# Patient Record
Sex: Female | Born: 1975 | Race: Black or African American | Hispanic: No | Marital: Married | Smoking: Never smoker
Health system: Southern US, Community
[De-identification: ages and names within clinical notes are randomized; demographics above are authoritative.]

## PROBLEM LIST (undated history)

## (undated) DIAGNOSIS — I10 Essential (primary) hypertension: Secondary | ICD-10-CM

## (undated) DIAGNOSIS — F419 Anxiety disorder, unspecified: Secondary | ICD-10-CM

## (undated) DIAGNOSIS — D649 Anemia, unspecified: Secondary | ICD-10-CM

## (undated) DIAGNOSIS — R569 Unspecified convulsions: Secondary | ICD-10-CM

## (undated) HISTORY — PX: WISDOM TOOTH EXTRACTION: SHX21

## (undated) HISTORY — DX: Anemia, unspecified: D64.9

## (undated) HISTORY — DX: Anxiety disorder, unspecified: F41.9

## (undated) HISTORY — DX: Unspecified convulsions: R56.9

## (undated) HISTORY — PX: OTHER SURGICAL HISTORY: SHX169

---

## 2005-06-12 ENCOUNTER — Other Ambulatory Visit: Admission: RE | Admit: 2005-06-12 | Discharge: 2005-06-12 | Payer: Self-pay | Admitting: Obstetrics and Gynecology

## 2008-01-11 ENCOUNTER — Other Ambulatory Visit: Admission: RE | Admit: 2008-01-11 | Discharge: 2008-01-11 | Payer: Self-pay | Admitting: Family Medicine

## 2010-08-29 ENCOUNTER — Other Ambulatory Visit (HOSPITAL_COMMUNITY)
Admission: RE | Admit: 2010-08-29 | Discharge: 2010-08-29 | Disposition: A | Discharge status: Home / Self Care | Payer: BC Managed Care – HMO | Source: Ambulatory Visit | Attending: Family Medicine | Admitting: Family Medicine

## 2010-08-29 DIAGNOSIS — Z0000 Encounter for general adult medical examination without abnormal findings: Secondary | ICD-10-CM | POA: Insufficient documentation

## 2011-09-08 ENCOUNTER — Other Ambulatory Visit (HOSPITAL_COMMUNITY)
Admission: RE | Admit: 2011-09-08 | Discharge: 2011-09-08 | Disposition: A | Discharge status: Home / Self Care | Payer: BC Managed Care – HMO | Source: Ambulatory Visit | Attending: Obstetrics and Gynecology | Admitting: Obstetrics and Gynecology

## 2011-09-08 DIAGNOSIS — Z01419 Encounter for gynecological examination (general) (routine) without abnormal findings: Secondary | ICD-10-CM | POA: Insufficient documentation

## 2014-07-26 ENCOUNTER — Other Ambulatory Visit: Payer: Self-pay | Admitting: Physician Assistant

## 2014-07-26 ENCOUNTER — Ambulatory Visit
Admission: RE | Admit: 2014-07-26 | Discharge: 2014-07-26 | Disposition: A | Discharge status: Home / Self Care | Payer: BLUE CROSS/BLUE SHIELD | Source: Ambulatory Visit | Attending: Physician Assistant | Admitting: Physician Assistant

## 2014-07-26 DIAGNOSIS — R058 Other specified cough: Secondary | ICD-10-CM

## 2014-07-26 DIAGNOSIS — R05 Cough: Secondary | ICD-10-CM

## 2015-07-10 ENCOUNTER — Other Ambulatory Visit: Payer: Self-pay | Admitting: Physician Assistant

## 2015-07-10 ENCOUNTER — Other Ambulatory Visit (HOSPITAL_COMMUNITY)
Admission: RE | Admit: 2015-07-10 | Discharge: 2015-07-10 | Disposition: A | Discharge status: Home / Self Care | Payer: BLUE CROSS/BLUE SHIELD | Source: Ambulatory Visit | Attending: Physician Assistant | Admitting: Physician Assistant

## 2015-07-10 DIAGNOSIS — Z01419 Encounter for gynecological examination (general) (routine) without abnormal findings: Secondary | ICD-10-CM | POA: Diagnosis present

## 2015-07-10 DIAGNOSIS — Z1151 Encounter for screening for human papillomavirus (HPV): Secondary | ICD-10-CM | POA: Diagnosis present

## 2015-07-11 LAB — CYTOLOGY - PAP

## 2015-10-03 DIAGNOSIS — F4322 Adjustment disorder with anxiety: Secondary | ICD-10-CM | POA: Diagnosis not present

## 2015-10-10 DIAGNOSIS — F4322 Adjustment disorder with anxiety: Secondary | ICD-10-CM | POA: Diagnosis not present

## 2015-10-17 DIAGNOSIS — F4322 Adjustment disorder with anxiety: Secondary | ICD-10-CM | POA: Diagnosis not present

## 2015-11-28 DIAGNOSIS — F4322 Adjustment disorder with anxiety: Secondary | ICD-10-CM | POA: Diagnosis not present

## 2015-12-12 DIAGNOSIS — F4322 Adjustment disorder with anxiety: Secondary | ICD-10-CM | POA: Diagnosis not present

## 2015-12-19 DIAGNOSIS — F4322 Adjustment disorder with anxiety: Secondary | ICD-10-CM | POA: Diagnosis not present

## 2016-01-02 DIAGNOSIS — F4322 Adjustment disorder with anxiety: Secondary | ICD-10-CM | POA: Diagnosis not present

## 2016-01-09 DIAGNOSIS — F4322 Adjustment disorder with anxiety: Secondary | ICD-10-CM | POA: Diagnosis not present

## 2016-01-30 DIAGNOSIS — F4322 Adjustment disorder with anxiety: Secondary | ICD-10-CM | POA: Diagnosis not present

## 2016-02-20 DIAGNOSIS — F4322 Adjustment disorder with anxiety: Secondary | ICD-10-CM | POA: Diagnosis not present

## 2016-03-06 DIAGNOSIS — F4322 Adjustment disorder with anxiety: Secondary | ICD-10-CM | POA: Diagnosis not present

## 2016-03-13 DIAGNOSIS — F4322 Adjustment disorder with anxiety: Secondary | ICD-10-CM | POA: Diagnosis not present

## 2016-03-20 DIAGNOSIS — F4322 Adjustment disorder with anxiety: Secondary | ICD-10-CM | POA: Diagnosis not present

## 2016-03-27 DIAGNOSIS — F4322 Adjustment disorder with anxiety: Secondary | ICD-10-CM | POA: Diagnosis not present

## 2016-04-03 DIAGNOSIS — F4322 Adjustment disorder with anxiety: Secondary | ICD-10-CM | POA: Diagnosis not present

## 2016-04-10 DIAGNOSIS — F4322 Adjustment disorder with anxiety: Secondary | ICD-10-CM | POA: Diagnosis not present

## 2016-04-17 DIAGNOSIS — F4322 Adjustment disorder with anxiety: Secondary | ICD-10-CM | POA: Diagnosis not present

## 2016-05-01 DIAGNOSIS — F4322 Adjustment disorder with anxiety: Secondary | ICD-10-CM | POA: Diagnosis not present

## 2016-05-05 DIAGNOSIS — H52223 Regular astigmatism, bilateral: Secondary | ICD-10-CM | POA: Diagnosis not present

## 2016-05-08 DIAGNOSIS — F4322 Adjustment disorder with anxiety: Secondary | ICD-10-CM | POA: Diagnosis not present

## 2016-05-22 DIAGNOSIS — F4322 Adjustment disorder with anxiety: Secondary | ICD-10-CM | POA: Diagnosis not present

## 2016-05-29 DIAGNOSIS — F4322 Adjustment disorder with anxiety: Secondary | ICD-10-CM | POA: Diagnosis not present

## 2016-06-05 DIAGNOSIS — F4322 Adjustment disorder with anxiety: Secondary | ICD-10-CM | POA: Diagnosis not present

## 2016-07-03 DIAGNOSIS — F4322 Adjustment disorder with anxiety: Secondary | ICD-10-CM | POA: Diagnosis not present

## 2016-07-17 DIAGNOSIS — F4322 Adjustment disorder with anxiety: Secondary | ICD-10-CM | POA: Diagnosis not present

## 2016-07-24 DIAGNOSIS — F4322 Adjustment disorder with anxiety: Secondary | ICD-10-CM | POA: Diagnosis not present

## 2016-07-29 DIAGNOSIS — Z23 Encounter for immunization: Secondary | ICD-10-CM | POA: Diagnosis not present

## 2016-08-07 DIAGNOSIS — F4322 Adjustment disorder with anxiety: Secondary | ICD-10-CM | POA: Diagnosis not present

## 2016-08-21 DIAGNOSIS — F411 Generalized anxiety disorder: Secondary | ICD-10-CM | POA: Diagnosis not present

## 2016-08-21 DIAGNOSIS — F4322 Adjustment disorder with anxiety: Secondary | ICD-10-CM | POA: Diagnosis not present

## 2016-08-26 DIAGNOSIS — F411 Generalized anxiety disorder: Secondary | ICD-10-CM | POA: Diagnosis not present

## 2016-08-29 DIAGNOSIS — F411 Generalized anxiety disorder: Secondary | ICD-10-CM | POA: Diagnosis not present

## 2016-09-04 DIAGNOSIS — F4322 Adjustment disorder with anxiety: Secondary | ICD-10-CM | POA: Diagnosis not present

## 2016-09-11 DIAGNOSIS — F4322 Adjustment disorder with anxiety: Secondary | ICD-10-CM | POA: Diagnosis not present

## 2016-09-25 DIAGNOSIS — F4322 Adjustment disorder with anxiety: Secondary | ICD-10-CM | POA: Diagnosis not present

## 2016-10-02 DIAGNOSIS — F4322 Adjustment disorder with anxiety: Secondary | ICD-10-CM | POA: Diagnosis not present

## 2016-10-23 DIAGNOSIS — F4322 Adjustment disorder with anxiety: Secondary | ICD-10-CM | POA: Diagnosis not present

## 2016-10-30 DIAGNOSIS — F4322 Adjustment disorder with anxiety: Secondary | ICD-10-CM | POA: Diagnosis not present

## 2016-11-06 DIAGNOSIS — F4322 Adjustment disorder with anxiety: Secondary | ICD-10-CM | POA: Diagnosis not present

## 2016-11-12 DIAGNOSIS — R5383 Other fatigue: Secondary | ICD-10-CM | POA: Diagnosis not present

## 2016-11-12 DIAGNOSIS — F419 Anxiety disorder, unspecified: Secondary | ICD-10-CM | POA: Diagnosis not present

## 2016-11-12 DIAGNOSIS — E559 Vitamin D deficiency, unspecified: Secondary | ICD-10-CM | POA: Diagnosis not present

## 2016-11-14 DIAGNOSIS — F4322 Adjustment disorder with anxiety: Secondary | ICD-10-CM | POA: Diagnosis not present

## 2016-11-20 DIAGNOSIS — F4322 Adjustment disorder with anxiety: Secondary | ICD-10-CM | POA: Diagnosis not present

## 2016-11-27 DIAGNOSIS — F4322 Adjustment disorder with anxiety: Secondary | ICD-10-CM | POA: Diagnosis not present

## 2016-12-04 DIAGNOSIS — F4322 Adjustment disorder with anxiety: Secondary | ICD-10-CM | POA: Diagnosis not present

## 2016-12-11 DIAGNOSIS — F4322 Adjustment disorder with anxiety: Secondary | ICD-10-CM | POA: Diagnosis not present

## 2016-12-16 DIAGNOSIS — F4322 Adjustment disorder with anxiety: Secondary | ICD-10-CM | POA: Diagnosis not present

## 2017-01-01 DIAGNOSIS — F4322 Adjustment disorder with anxiety: Secondary | ICD-10-CM | POA: Diagnosis not present

## 2017-01-08 DIAGNOSIS — F4322 Adjustment disorder with anxiety: Secondary | ICD-10-CM | POA: Diagnosis not present

## 2017-01-29 DIAGNOSIS — F4322 Adjustment disorder with anxiety: Secondary | ICD-10-CM | POA: Diagnosis not present

## 2017-02-04 DIAGNOSIS — F4322 Adjustment disorder with anxiety: Secondary | ICD-10-CM | POA: Diagnosis not present

## 2017-02-11 DIAGNOSIS — F4322 Adjustment disorder with anxiety: Secondary | ICD-10-CM | POA: Diagnosis not present

## 2017-02-20 DIAGNOSIS — F4322 Adjustment disorder with anxiety: Secondary | ICD-10-CM | POA: Diagnosis not present

## 2017-03-13 DIAGNOSIS — F4322 Adjustment disorder with anxiety: Secondary | ICD-10-CM | POA: Diagnosis not present

## 2017-04-10 DIAGNOSIS — H52223 Regular astigmatism, bilateral: Secondary | ICD-10-CM | POA: Diagnosis not present

## 2017-05-11 DIAGNOSIS — Z23 Encounter for immunization: Secondary | ICD-10-CM | POA: Diagnosis not present

## 2017-05-11 DIAGNOSIS — E559 Vitamin D deficiency, unspecified: Secondary | ICD-10-CM | POA: Diagnosis not present

## 2017-05-11 DIAGNOSIS — F419 Anxiety disorder, unspecified: Secondary | ICD-10-CM | POA: Diagnosis not present

## 2017-05-11 DIAGNOSIS — Z13220 Encounter for screening for lipoid disorders: Secondary | ICD-10-CM | POA: Diagnosis not present

## 2017-05-11 DIAGNOSIS — Z0000 Encounter for general adult medical examination without abnormal findings: Secondary | ICD-10-CM | POA: Diagnosis not present

## 2017-05-11 DIAGNOSIS — R030 Elevated blood-pressure reading, without diagnosis of hypertension: Secondary | ICD-10-CM | POA: Diagnosis not present

## 2017-05-29 DIAGNOSIS — F4312 Post-traumatic stress disorder, chronic: Secondary | ICD-10-CM | POA: Diagnosis not present

## 2017-06-05 DIAGNOSIS — F4312 Post-traumatic stress disorder, chronic: Secondary | ICD-10-CM | POA: Diagnosis not present

## 2017-06-12 DIAGNOSIS — F4312 Post-traumatic stress disorder, chronic: Secondary | ICD-10-CM | POA: Diagnosis not present

## 2017-06-19 DIAGNOSIS — F4312 Post-traumatic stress disorder, chronic: Secondary | ICD-10-CM | POA: Diagnosis not present

## 2017-06-26 DIAGNOSIS — F4312 Post-traumatic stress disorder, chronic: Secondary | ICD-10-CM | POA: Diagnosis not present

## 2017-07-03 DIAGNOSIS — F4312 Post-traumatic stress disorder, chronic: Secondary | ICD-10-CM | POA: Diagnosis not present

## 2017-07-17 DIAGNOSIS — F4312 Post-traumatic stress disorder, chronic: Secondary | ICD-10-CM | POA: Diagnosis not present

## 2017-07-31 DIAGNOSIS — F4312 Post-traumatic stress disorder, chronic: Secondary | ICD-10-CM | POA: Diagnosis not present

## 2017-08-07 DIAGNOSIS — F4312 Post-traumatic stress disorder, chronic: Secondary | ICD-10-CM | POA: Diagnosis not present

## 2017-08-14 DIAGNOSIS — F4312 Post-traumatic stress disorder, chronic: Secondary | ICD-10-CM | POA: Diagnosis not present

## 2017-08-21 DIAGNOSIS — F4312 Post-traumatic stress disorder, chronic: Secondary | ICD-10-CM | POA: Diagnosis not present

## 2017-08-25 DIAGNOSIS — F4322 Adjustment disorder with anxiety: Secondary | ICD-10-CM | POA: Diagnosis not present

## 2017-08-28 DIAGNOSIS — F4312 Post-traumatic stress disorder, chronic: Secondary | ICD-10-CM | POA: Diagnosis not present

## 2017-09-04 DIAGNOSIS — F4312 Post-traumatic stress disorder, chronic: Secondary | ICD-10-CM | POA: Diagnosis not present

## 2017-09-18 DIAGNOSIS — F4312 Post-traumatic stress disorder, chronic: Secondary | ICD-10-CM | POA: Diagnosis not present

## 2017-09-25 DIAGNOSIS — F4312 Post-traumatic stress disorder, chronic: Secondary | ICD-10-CM | POA: Diagnosis not present

## 2017-09-29 DIAGNOSIS — F4312 Post-traumatic stress disorder, chronic: Secondary | ICD-10-CM | POA: Diagnosis not present

## 2017-10-09 DIAGNOSIS — F4312 Post-traumatic stress disorder, chronic: Secondary | ICD-10-CM | POA: Diagnosis not present

## 2017-10-16 DIAGNOSIS — F4312 Post-traumatic stress disorder, chronic: Secondary | ICD-10-CM | POA: Diagnosis not present

## 2017-10-23 DIAGNOSIS — F4312 Post-traumatic stress disorder, chronic: Secondary | ICD-10-CM | POA: Diagnosis not present

## 2017-10-30 DIAGNOSIS — F4312 Post-traumatic stress disorder, chronic: Secondary | ICD-10-CM | POA: Diagnosis not present

## 2017-11-09 DIAGNOSIS — F419 Anxiety disorder, unspecified: Secondary | ICD-10-CM | POA: Diagnosis not present

## 2017-11-09 DIAGNOSIS — E559 Vitamin D deficiency, unspecified: Secondary | ICD-10-CM | POA: Diagnosis not present

## 2017-11-20 DIAGNOSIS — F4312 Post-traumatic stress disorder, chronic: Secondary | ICD-10-CM | POA: Diagnosis not present

## 2017-12-04 DIAGNOSIS — F4312 Post-traumatic stress disorder, chronic: Secondary | ICD-10-CM | POA: Diagnosis not present

## 2017-12-18 DIAGNOSIS — F4312 Post-traumatic stress disorder, chronic: Secondary | ICD-10-CM | POA: Diagnosis not present

## 2017-12-21 ENCOUNTER — Other Ambulatory Visit: Payer: Self-pay | Admitting: Obstetrics and Gynecology

## 2017-12-21 DIAGNOSIS — N840 Polyp of corpus uteri: Secondary | ICD-10-CM | POA: Diagnosis not present

## 2017-12-21 DIAGNOSIS — Z30433 Encounter for removal and reinsertion of intrauterine contraceptive device: Secondary | ICD-10-CM | POA: Diagnosis not present

## 2017-12-29 DIAGNOSIS — F4312 Post-traumatic stress disorder, chronic: Secondary | ICD-10-CM | POA: Diagnosis not present

## 2018-01-05 DIAGNOSIS — F4312 Post-traumatic stress disorder, chronic: Secondary | ICD-10-CM | POA: Diagnosis not present

## 2018-01-12 DIAGNOSIS — F4312 Post-traumatic stress disorder, chronic: Secondary | ICD-10-CM | POA: Diagnosis not present

## 2018-01-19 DIAGNOSIS — F4312 Post-traumatic stress disorder, chronic: Secondary | ICD-10-CM | POA: Diagnosis not present

## 2018-02-01 DIAGNOSIS — Z30431 Encounter for routine checking of intrauterine contraceptive device: Secondary | ICD-10-CM | POA: Diagnosis not present

## 2018-02-03 DIAGNOSIS — I10 Essential (primary) hypertension: Secondary | ICD-10-CM | POA: Diagnosis not present

## 2018-02-03 DIAGNOSIS — D649 Anemia, unspecified: Secondary | ICD-10-CM | POA: Diagnosis not present

## 2018-02-03 DIAGNOSIS — E559 Vitamin D deficiency, unspecified: Secondary | ICD-10-CM | POA: Diagnosis not present

## 2018-02-10 DIAGNOSIS — Z30431 Encounter for routine checking of intrauterine contraceptive device: Secondary | ICD-10-CM | POA: Diagnosis not present

## 2018-02-16 DIAGNOSIS — F4312 Post-traumatic stress disorder, chronic: Secondary | ICD-10-CM | POA: Diagnosis not present

## 2018-02-23 DIAGNOSIS — F4312 Post-traumatic stress disorder, chronic: Secondary | ICD-10-CM | POA: Diagnosis not present

## 2018-03-02 DIAGNOSIS — F4312 Post-traumatic stress disorder, chronic: Secondary | ICD-10-CM | POA: Diagnosis not present

## 2018-03-16 DIAGNOSIS — F4312 Post-traumatic stress disorder, chronic: Secondary | ICD-10-CM | POA: Diagnosis not present

## 2018-03-23 DIAGNOSIS — F4312 Post-traumatic stress disorder, chronic: Secondary | ICD-10-CM | POA: Diagnosis not present

## 2018-03-30 DIAGNOSIS — F4312 Post-traumatic stress disorder, chronic: Secondary | ICD-10-CM | POA: Diagnosis not present

## 2018-04-06 DIAGNOSIS — F4312 Post-traumatic stress disorder, chronic: Secondary | ICD-10-CM | POA: Diagnosis not present

## 2018-04-20 DIAGNOSIS — F4312 Post-traumatic stress disorder, chronic: Secondary | ICD-10-CM | POA: Diagnosis not present

## 2018-04-27 DIAGNOSIS — F4312 Post-traumatic stress disorder, chronic: Secondary | ICD-10-CM | POA: Diagnosis not present

## 2018-05-04 DIAGNOSIS — F4312 Post-traumatic stress disorder, chronic: Secondary | ICD-10-CM | POA: Diagnosis not present

## 2018-05-11 DIAGNOSIS — F4312 Post-traumatic stress disorder, chronic: Secondary | ICD-10-CM | POA: Diagnosis not present

## 2018-05-17 DIAGNOSIS — E559 Vitamin D deficiency, unspecified: Secondary | ICD-10-CM | POA: Diagnosis not present

## 2018-05-17 DIAGNOSIS — F419 Anxiety disorder, unspecified: Secondary | ICD-10-CM | POA: Diagnosis not present

## 2018-05-17 DIAGNOSIS — Z0000 Encounter for general adult medical examination without abnormal findings: Secondary | ICD-10-CM | POA: Diagnosis not present

## 2018-05-17 DIAGNOSIS — Z23 Encounter for immunization: Secondary | ICD-10-CM | POA: Diagnosis not present

## 2018-05-17 DIAGNOSIS — I10 Essential (primary) hypertension: Secondary | ICD-10-CM | POA: Diagnosis not present

## 2018-05-18 DIAGNOSIS — F4312 Post-traumatic stress disorder, chronic: Secondary | ICD-10-CM | POA: Diagnosis not present

## 2018-05-25 DIAGNOSIS — F4312 Post-traumatic stress disorder, chronic: Secondary | ICD-10-CM | POA: Diagnosis not present

## 2018-06-29 DIAGNOSIS — F4312 Post-traumatic stress disorder, chronic: Secondary | ICD-10-CM | POA: Diagnosis not present

## 2018-06-30 DIAGNOSIS — H524 Presbyopia: Secondary | ICD-10-CM | POA: Diagnosis not present

## 2018-07-09 DIAGNOSIS — F4312 Post-traumatic stress disorder, chronic: Secondary | ICD-10-CM | POA: Diagnosis not present

## 2018-07-13 DIAGNOSIS — F4312 Post-traumatic stress disorder, chronic: Secondary | ICD-10-CM | POA: Diagnosis not present

## 2018-07-20 DIAGNOSIS — F4312 Post-traumatic stress disorder, chronic: Secondary | ICD-10-CM | POA: Diagnosis not present

## 2018-08-03 DIAGNOSIS — F4312 Post-traumatic stress disorder, chronic: Secondary | ICD-10-CM | POA: Diagnosis not present

## 2018-08-10 DIAGNOSIS — F4312 Post-traumatic stress disorder, chronic: Secondary | ICD-10-CM | POA: Diagnosis not present

## 2018-08-17 DIAGNOSIS — F4312 Post-traumatic stress disorder, chronic: Secondary | ICD-10-CM | POA: Diagnosis not present

## 2018-08-24 DIAGNOSIS — F4312 Post-traumatic stress disorder, chronic: Secondary | ICD-10-CM | POA: Diagnosis not present

## 2018-08-31 DIAGNOSIS — F4312 Post-traumatic stress disorder, chronic: Secondary | ICD-10-CM | POA: Diagnosis not present

## 2018-09-14 DIAGNOSIS — F4312 Post-traumatic stress disorder, chronic: Secondary | ICD-10-CM | POA: Diagnosis not present

## 2018-09-21 DIAGNOSIS — F4312 Post-traumatic stress disorder, chronic: Secondary | ICD-10-CM | POA: Diagnosis not present

## 2018-10-26 DIAGNOSIS — F4312 Post-traumatic stress disorder, chronic: Secondary | ICD-10-CM | POA: Diagnosis not present

## 2018-11-11 DIAGNOSIS — F4312 Post-traumatic stress disorder, chronic: Secondary | ICD-10-CM | POA: Diagnosis not present

## 2018-11-18 DIAGNOSIS — F4312 Post-traumatic stress disorder, chronic: Secondary | ICD-10-CM | POA: Diagnosis not present

## 2018-11-22 DIAGNOSIS — I10 Essential (primary) hypertension: Secondary | ICD-10-CM | POA: Diagnosis not present

## 2018-11-22 DIAGNOSIS — D508 Other iron deficiency anemias: Secondary | ICD-10-CM | POA: Diagnosis not present

## 2018-11-22 DIAGNOSIS — F419 Anxiety disorder, unspecified: Secondary | ICD-10-CM | POA: Diagnosis not present

## 2018-11-22 DIAGNOSIS — E559 Vitamin D deficiency, unspecified: Secondary | ICD-10-CM | POA: Diagnosis not present

## 2018-11-25 DIAGNOSIS — F4312 Post-traumatic stress disorder, chronic: Secondary | ICD-10-CM | POA: Diagnosis not present

## 2018-12-02 DIAGNOSIS — F4312 Post-traumatic stress disorder, chronic: Secondary | ICD-10-CM | POA: Diagnosis not present

## 2018-12-16 DIAGNOSIS — F4312 Post-traumatic stress disorder, chronic: Secondary | ICD-10-CM | POA: Diagnosis not present

## 2019-05-05 DIAGNOSIS — F4312 Post-traumatic stress disorder, chronic: Secondary | ICD-10-CM | POA: Diagnosis not present

## 2019-05-12 DIAGNOSIS — F4312 Post-traumatic stress disorder, chronic: Secondary | ICD-10-CM | POA: Diagnosis not present

## 2019-05-13 DIAGNOSIS — F4312 Post-traumatic stress disorder, chronic: Secondary | ICD-10-CM | POA: Diagnosis not present

## 2019-05-27 DIAGNOSIS — F4312 Post-traumatic stress disorder, chronic: Secondary | ICD-10-CM | POA: Diagnosis not present

## 2019-06-03 DIAGNOSIS — F4312 Post-traumatic stress disorder, chronic: Secondary | ICD-10-CM | POA: Diagnosis not present

## 2019-06-10 DIAGNOSIS — F4312 Post-traumatic stress disorder, chronic: Secondary | ICD-10-CM | POA: Diagnosis not present

## 2019-06-23 DIAGNOSIS — F4312 Post-traumatic stress disorder, chronic: Secondary | ICD-10-CM | POA: Diagnosis not present

## 2019-06-30 DIAGNOSIS — F4312 Post-traumatic stress disorder, chronic: Secondary | ICD-10-CM | POA: Diagnosis not present

## 2019-07-08 DIAGNOSIS — F4312 Post-traumatic stress disorder, chronic: Secondary | ICD-10-CM | POA: Diagnosis not present

## 2019-07-22 DIAGNOSIS — F4312 Post-traumatic stress disorder, chronic: Secondary | ICD-10-CM | POA: Diagnosis not present

## 2019-07-29 DIAGNOSIS — F4312 Post-traumatic stress disorder, chronic: Secondary | ICD-10-CM | POA: Diagnosis not present

## 2019-08-12 DIAGNOSIS — F4312 Post-traumatic stress disorder, chronic: Secondary | ICD-10-CM | POA: Diagnosis not present

## 2019-08-19 DIAGNOSIS — F4312 Post-traumatic stress disorder, chronic: Secondary | ICD-10-CM | POA: Diagnosis not present

## 2020-04-25 DIAGNOSIS — Z23 Encounter for immunization: Secondary | ICD-10-CM | POA: Diagnosis not present

## 2020-06-07 ENCOUNTER — Other Ambulatory Visit: Payer: Self-pay | Admitting: Family Medicine

## 2020-06-07 DIAGNOSIS — I10 Essential (primary) hypertension: Secondary | ICD-10-CM | POA: Diagnosis not present

## 2020-06-07 DIAGNOSIS — Z1231 Encounter for screening mammogram for malignant neoplasm of breast: Secondary | ICD-10-CM

## 2020-06-07 DIAGNOSIS — Z6841 Body Mass Index (BMI) 40.0 and over, adult: Secondary | ICD-10-CM | POA: Diagnosis not present

## 2020-06-07 DIAGNOSIS — F411 Generalized anxiety disorder: Secondary | ICD-10-CM | POA: Diagnosis not present

## 2020-06-08 DIAGNOSIS — F411 Generalized anxiety disorder: Secondary | ICD-10-CM | POA: Diagnosis not present

## 2020-06-08 DIAGNOSIS — I10 Essential (primary) hypertension: Secondary | ICD-10-CM | POA: Diagnosis not present

## 2020-06-13 DIAGNOSIS — I10 Essential (primary) hypertension: Secondary | ICD-10-CM | POA: Diagnosis not present

## 2020-06-13 DIAGNOSIS — Z6841 Body Mass Index (BMI) 40.0 and over, adult: Secondary | ICD-10-CM | POA: Diagnosis not present

## 2020-06-13 DIAGNOSIS — Z1331 Encounter for screening for depression: Secondary | ICD-10-CM | POA: Diagnosis not present

## 2020-06-13 DIAGNOSIS — F411 Generalized anxiety disorder: Secondary | ICD-10-CM | POA: Diagnosis not present

## 2020-06-25 DIAGNOSIS — Z03818 Encounter for observation for suspected exposure to other biological agents ruled out: Secondary | ICD-10-CM | POA: Diagnosis not present

## 2020-07-19 ENCOUNTER — Ambulatory Visit
Admission: RE | Admit: 2020-07-19 | Discharge: 2020-07-19 | Disposition: A | Discharge status: Home / Self Care | Payer: BC Managed Care – PPO | Source: Ambulatory Visit | Attending: Family Medicine | Admitting: Family Medicine

## 2020-07-19 ENCOUNTER — Other Ambulatory Visit: Payer: Self-pay

## 2020-07-19 DIAGNOSIS — Z1231 Encounter for screening mammogram for malignant neoplasm of breast: Secondary | ICD-10-CM | POA: Diagnosis not present

## 2020-07-23 ENCOUNTER — Other Ambulatory Visit: Payer: Self-pay | Admitting: Family Medicine

## 2020-07-23 DIAGNOSIS — R928 Other abnormal and inconclusive findings on diagnostic imaging of breast: Secondary | ICD-10-CM

## 2020-09-03 DIAGNOSIS — Z03818 Encounter for observation for suspected exposure to other biological agents ruled out: Secondary | ICD-10-CM | POA: Diagnosis not present

## 2020-09-07 ENCOUNTER — Other Ambulatory Visit: Payer: BC Managed Care – PPO

## 2020-09-10 ENCOUNTER — Other Ambulatory Visit: Payer: Self-pay | Admitting: Family Medicine

## 2020-09-10 DIAGNOSIS — R928 Other abnormal and inconclusive findings on diagnostic imaging of breast: Secondary | ICD-10-CM

## 2020-12-05 ENCOUNTER — Ambulatory Visit
Admission: RE | Admit: 2020-12-05 | Discharge: 2020-12-05 | Disposition: A | Discharge status: Home / Self Care | Payer: BC Managed Care – PPO | Source: Ambulatory Visit | Attending: Family Medicine | Admitting: Family Medicine

## 2020-12-05 ENCOUNTER — Other Ambulatory Visit: Payer: Self-pay | Admitting: Family Medicine

## 2020-12-05 ENCOUNTER — Other Ambulatory Visit: Payer: Self-pay

## 2020-12-05 DIAGNOSIS — R928 Other abnormal and inconclusive findings on diagnostic imaging of breast: Secondary | ICD-10-CM

## 2020-12-05 DIAGNOSIS — R922 Inconclusive mammogram: Secondary | ICD-10-CM | POA: Diagnosis not present

## 2020-12-11 ENCOUNTER — Ambulatory Visit
Admission: RE | Admit: 2020-12-11 | Discharge: 2020-12-11 | Disposition: A | Discharge status: Home / Self Care | Payer: BC Managed Care – PPO | Source: Ambulatory Visit | Attending: Family Medicine | Admitting: Family Medicine

## 2020-12-11 ENCOUNTER — Other Ambulatory Visit: Payer: Self-pay | Admitting: Family Medicine

## 2020-12-11 ENCOUNTER — Other Ambulatory Visit: Payer: Self-pay

## 2020-12-11 DIAGNOSIS — R928 Other abnormal and inconclusive findings on diagnostic imaging of breast: Secondary | ICD-10-CM | POA: Diagnosis not present

## 2020-12-11 DIAGNOSIS — R590 Localized enlarged lymph nodes: Secondary | ICD-10-CM | POA: Diagnosis not present

## 2020-12-12 LAB — SURGICAL PATHOLOGY

## 2021-05-15 DIAGNOSIS — F4312 Post-traumatic stress disorder, chronic: Secondary | ICD-10-CM | POA: Diagnosis not present

## 2021-06-07 DIAGNOSIS — F411 Generalized anxiety disorder: Secondary | ICD-10-CM | POA: Diagnosis not present

## 2021-06-07 DIAGNOSIS — F4312 Post-traumatic stress disorder, chronic: Secondary | ICD-10-CM | POA: Diagnosis not present

## 2021-12-17 ENCOUNTER — Encounter (HOSPITAL_COMMUNITY): Payer: Self-pay

## 2021-12-17 ENCOUNTER — Ambulatory Visit (HOSPITAL_COMMUNITY)
Admission: EM | Admit: 2021-12-17 | Discharge: 2021-12-17 | Disposition: A | Discharge status: Home / Self Care | Payer: BC Managed Care – PPO

## 2021-12-17 DIAGNOSIS — I10 Essential (primary) hypertension: Secondary | ICD-10-CM

## 2021-12-17 DIAGNOSIS — S80861A Insect bite (nonvenomous), right lower leg, initial encounter: Secondary | ICD-10-CM

## 2021-12-17 DIAGNOSIS — W57XXXA Bitten or stung by nonvenomous insect and other nonvenomous arthropods, initial encounter: Secondary | ICD-10-CM | POA: Diagnosis not present

## 2021-12-17 HISTORY — DX: Essential (primary) hypertension: I10

## 2021-12-17 MED ORDER — TRIAMCINOLONE ACETONIDE 0.1 % EX CREA: 1.0000 | TOPICAL_CREAM | Freq: Two times a day (BID) | CUTANEOUS | 0 refills | Status: AC

## 2021-12-19 ENCOUNTER — Ambulatory Visit (HOSPITAL_COMMUNITY)
Admission: EM | Admit: 2021-12-19 | Discharge: 2021-12-19 | Disposition: A | Discharge status: Home / Self Care | Payer: BC Managed Care – PPO | Attending: Internal Medicine | Admitting: Internal Medicine

## 2021-12-19 ENCOUNTER — Encounter (HOSPITAL_COMMUNITY): Payer: Self-pay

## 2021-12-19 DIAGNOSIS — R519 Headache, unspecified: Secondary | ICD-10-CM | POA: Diagnosis present

## 2021-12-19 DIAGNOSIS — I10 Essential (primary) hypertension: Secondary | ICD-10-CM

## 2021-12-19 DIAGNOSIS — I160 Hypertensive urgency: Secondary | ICD-10-CM

## 2021-12-19 LAB — CBC
HCT: 38.2 % (ref 36.0–46.0)
Hemoglobin: 12.4 g/dL (ref 12.0–15.0)
MCH: 25.6 pg — ABNORMAL LOW (ref 26.0–34.0)
MCHC: 32.5 g/dL (ref 30.0–36.0)
MCV: 78.9 fL — ABNORMAL LOW (ref 80.0–100.0)
Platelets: 345 10*3/uL (ref 150–400)
RBC: 4.84 MIL/uL (ref 3.87–5.11)
RDW: 13.4 % (ref 11.5–15.5)
WBC: 5.3 10*3/uL (ref 4.0–10.5)
nRBC: 0 % (ref 0.0–0.2)

## 2021-12-19 LAB — COMPREHENSIVE METABOLIC PANEL
ALT: 11 U/L (ref 0–44)
AST: 14 U/L — ABNORMAL LOW (ref 15–41)
Albumin: 3.5 g/dL (ref 3.5–5.0)
Alkaline Phosphatase: 80 U/L (ref 38–126)
Anion gap: 13 (ref 5–15)
BUN: 7 mg/dL (ref 6–20)
CO2: 23 mmol/L (ref 22–32)
Calcium: 8.8 mg/dL — ABNORMAL LOW (ref 8.9–10.3)
Chloride: 102 mmol/L (ref 98–111)
Creatinine, Ser: 0.86 mg/dL (ref 0.44–1.00)
GFR, Estimated: >60 mL/min (ref >60)
Glucose, Bld: 82 mg/dL (ref 70–99)
Potassium: 3.3 mmol/L — ABNORMAL LOW (ref 3.5–5.1)
Sodium: 138 mmol/L (ref 135–145)
Total Bilirubin: 0.8 mg/dL (ref 0.3–1.2)
Total Protein: 7.7 g/dL (ref 6.5–8.1)

## 2021-12-19 MED ORDER — AMLODIPINE BESYLATE 10 MG PO TABS: 10.0000 mg | ORAL_TABLET | Freq: Every day | ORAL | 1 refills | Status: DC

## 2021-12-19 MED ORDER — VALSARTAN 160 MG PO TABS: 160.0000 mg | ORAL_TABLET | Freq: Every day | ORAL | 1 refills | Status: DC

## 2021-12-19 MED ORDER — VALSARTAN 160 MG PO TABS: 320.0000 mg | ORAL_TABLET | Freq: Every day | ORAL | 1 refills | Status: DC

## 2021-12-19 MED ORDER — ACETAMINOPHEN 500 MG PO TABS: 1000.0000 mg | ORAL_TABLET | Freq: Four times a day (QID) | ORAL | 0 refills | Status: AC | PRN

## 2021-12-19 NOTE — ED Provider Notes (Signed)
[facility]    CSN: [id number] Arrival date & time: 12/19/21  0840      History   Chief Complaint Chief Complaint  Patient presents with   Hypertension    [name] is a 46 y.o. female.   Patient presents to urgent care for evaluation of elevated blood pressure.  She states that she was seen here couple of days ago with elevated blood pressure and told to monitor her blood pressure at home and return if it continues to be elevated.  Patient states that her home blood pressure cuff was reading 180s over 100s this morning and she developed a slight headache to the top of her head.  Currently rates head pain at a 3 on a scale of 0-10 and has not attempted use of any over-the-counter medications for this.  She has consistently been taking valsartan 160 mg once daily for the last 3 to 4 months.  She is in the process of changing primary care doctors and has an appointment with her new PCP at the end of July.  Denies blurry vision, decreased visual acuity, nausea, vomiting, back pain, URI symptoms, urinary symptoms, abdominal pain, diarrhea, constipation, and dizziness.  She is reporting a feeling of being "flushed" at this time.  Denies personal history of cardiovascular or neurologic problems.  No numbness or tingling reported. No other aggravating or relieving factors identified for symptoms at this time.    Hypertension    Past Medical History:  Diagnosis Date   Hypertension     There are no problems to display for this patient.   History reviewed. No pertinent surgical history.  OB History   No obstetric history on file.      Home Medications    Prior to Admission medications   Medication Sig Start Date End Date Taking? Authorizing Provider  acetaminophen (TYLENOL) 500 MG tablet Take 2 tablets (1,000 mg total) by mouth every 6 (six) hours as needed. 12/19/21  Yes Carlisle Beers, FNP  amLODipine (NORVASC) 10 MG tablet Take 1 tablet (10  mg total) by mouth daily. 12/19/21  Yes Carlisle Beers, FNP  valsartan (DIOVAN) 160 MG tablet Take 1 tablet (160 mg total) by mouth daily. 12/19/21  Yes Carlisle Beers, FNP  triamcinolone cream (KENALOG) 0.1 % Apply 1 Application topically 2 (two) times daily for 7 days. 12/17/21 12/24/21  Rhys Martini, PA-C    Family History Family History  Problem Relation Age of Onset   Breast cancer Neg Hx     Social History Social History   Tobacco Use   Smoking status: Never   Smokeless tobacco: Never  Substance Use Topics   Alcohol use: Not Currently   Drug use: Yes    Types: Marijuana     Allergies   Patient has no known allergies.   Review of Systems Review of Systems Per HPI  Physical Exam Triage Vital Signs ED Triage Vitals  Enc Vitals Group     BP 12/19/21 0921 (!) 218/136     Pulse Rate 12/19/21 0921 76     Resp 12/19/21 0921 18     Temp 12/19/21 0921 99 F (37.2 C)     Temp Source 12/19/21 0921 Oral     SpO2 12/19/21 0921 97 %     Weight --      Height --      Head Circumference --      Peak Flow --      Pain  Score 12/19/21 0922 1     Pain Loc --      Pain Edu? --      Excl. in GC? --    No data found.  Updated Vital Signs BP (!) 184/104 (BP Location: Left Arm)   Pulse 76   Temp 99 F (37.2 C) (Oral)   Resp 18   SpO2 97%   Visual Acuity Right Eye Distance:   Left Eye Distance:   Bilateral Distance:    Right Eye Near:   Left Eye Near:    Bilateral Near:     Physical Exam Vitals and nursing note reviewed.  Constitutional:      Appearance: Normal appearance. She is not ill-appearing or toxic-appearing.     Comments: Very pleasant patient sitting on exam in position of comfort table in no acute distress.   HENT:     Head: Normocephalic and atraumatic.     Right Ear: Hearing, tympanic membrane, ear canal and external ear normal.     Left Ear: Hearing, tympanic membrane, ear canal and external ear normal.     Nose: Nose normal.      Mouth/Throat:     Lips: Pink.     Mouth: Mucous membranes are moist.  Eyes:     General: Lids are normal. Vision grossly intact. Gaze aligned appropriately.     Extraocular Movements: Extraocular movements intact.     Conjunctiva/sclera: Conjunctivae normal.  Cardiovascular:     Rate and Rhythm: Normal rate and regular rhythm.     Heart sounds: Normal heart sounds, S1 normal and S2 normal.  Pulmonary:     Effort: Pulmonary effort is normal. No respiratory distress.     Breath sounds: Normal breath sounds and air entry.  Abdominal:     General: Bowel sounds are normal.     Palpations: Abdomen is soft.     Tenderness: There is no abdominal tenderness. There is no right CVA tenderness, left CVA tenderness or guarding.  Musculoskeletal:     Cervical back: Neck supple.  Skin:    General: Skin is warm and dry.     Capillary Refill: Capillary refill takes less than 2 seconds.     Findings: No rash.  Neurological:     General: No focal deficit present.     Mental Status: She is alert and oriented to person, place, and time. Mental status is at baseline.     Cranial Nerves: Cranial nerves 2-12 are intact. No dysarthria or facial asymmetry.     Sensory: Sensation is intact.     Motor: Motor function is intact. No weakness.     Coordination: Coordination is intact. Romberg sign negative. Coordination normal. Finger-Nose-Finger Test normal.     Gait: Gait is intact. Gait normal.     Comments: 5/5 strength against resistance with abduction and abduction of bilateral upper and lower extremities.  5/5 strength against resistance with neck movement from left to right.  No focal neurologic deficit identified to physical exam.  Psychiatric:        Mood and Affect: Mood normal.        Speech: Speech normal.        Behavior: Behavior normal.        Thought Content: Thought content normal.        Judgment: Judgment normal.      UC Treatments / Results  Labs (all labs ordered are listed, but  only abnormal results are displayed)   EKG   Radiology No results  found.  Procedures Procedures (including critical care time)  Medications Ordered in UC Medications - No data to display  Initial Impression / Assessment and Plan / UC Course  I have reviewed the triage vital signs and the nursing notes.  Pertinent labs & imaging results that were available during my care of the patient were reviewed by me and considered in my medical decision making (see chart for details).  1.  Hypertension Patient to continue taking valsartan 160 mg in the morning.  Patient to add amlodipine 10 mg to blood pressure medication regimen and attempt to gain better control of hypertension.  She is to follow-up with her primary care doctor in a few weeks regarding blood pressure management.  CBC and CMP drawn today as patient has not had blood work recently to check kidney function and blood levels.  Discussed the DASH diet and lifestyle modifications that can help to lower blood pressure.  Patient may take Tylenol as needed for headache.  Recommended increase water intake to at least 64 ounces of water per day to improve hydration status.  Patient to take her blood pressure twice daily and keep a log to bring to her primary care appointment to assist with medication management decision making. Strict ED return precautions given.  No clinical indication for further evaluation or advanced imaging at this time as patient's neurologic exam is without focal deficit.  EKG is negative for acute coronary abnormality and stable at this time.   Discussed physical exam and available lab work findings in clinic with patient.  Counseled patient regarding appropriate use of medications and potential side effects for all medications recommended or prescribed today. Discussed red flag signs and symptoms of worsening condition,when to call the PCP office, return to urgent care, and when to seek higher level of care in the  emergency department. Patient verbalizes understanding and agreement with plan. All questions answered. Patient discharged in stable condition.  Final Clinical Impressions(s) / UC Diagnoses   Final diagnoses:  Acute nonintractable headache, unspecified headache type  Hypertensive urgency     Discharge Instructions      Add amlodipine 10mg  to your blood pressure medication regimen and take this in the morning when you take your valsartan. Check your blood pressure twice daily and keep a log of this to bring to your primary care doctor appointment. We checked your lab work today and we will call you if anything is abnormal. Attempt to eat a low-salt diet to further improve your blood pressure. I have included information on the DASH diet in your paperwork.  You may take tylenol as needed for headache. Increase your water intake to at least 64 ounces of water per day to stay well hydrated.  If you develop any new or worsening symptoms or do not improve in the next 2 to 3 days, please return.  If your symptoms are severe, please go to the emergency room.  Follow-up with your primary care provider for further evaluation and management of your symptoms as well as ongoing wellness visits.  I hope you feel better!    ED Prescriptions     Medication Sig Dispense Auth. Provider   valsartan (DIOVAN) 160 MG tablet  (Status: Discontinued) Take 2 tablets (320 mg total) by mouth daily. 60 tablet M, M   acetaminophen (TYLENOL) 500 MG tablet Take 2 tablets (1,000 mg total) by mouth every 6 (six) hours as needed. 30 tablet [place] M, FNP   valsartan (DIOVAN) 160 MG  tablet Take 1 tablet (160 mg total) by mouth daily. 30 tablet Reita May M, FNP   amLODipine (NORVASC) 10 MG tablet Take 1 tablet (10 mg total) by mouth daily. 30 tablet Carlisle Beers, FNP      PDMP not reviewed this encounter.   Carlisle Beers, [place] 12/22/21 2036

## 2021-12-19 NOTE — ED Triage Notes (Signed)
Pt states having HTN in past 2 days with a mild headache. States called the online nurse last night and told to come in today.

## 2021-12-19 NOTE — Discharge Instructions (Addendum)
Add amlodipine 10mg  to your blood pressure medication regimen and take this in the morning when you take your valsartan. Check your blood pressure twice daily and keep a log of this to bring to your primary care doctor appointment. We checked your lab work today and we will call you if anything is abnormal. Attempt to eat a low-salt diet to further improve your blood pressure. I have included information on the DASH diet in your paperwork.  You may take tylenol as needed for headache. Increase your water intake to at least 64 ounces of water per day to stay well hydrated.  If you develop any new or worsening symptoms or do not improve in the next 2 to 3 days, please return.  If your symptoms are severe, please go to the emergency room.  Follow-up with your primary care provider for further evaluation and management of your symptoms as well as ongoing wellness visits.  I hope you feel better!

## 2022-01-16 ENCOUNTER — Encounter: Payer: Self-pay | Admitting: Family Medicine

## 2022-01-16 ENCOUNTER — Ambulatory Visit (INDEPENDENT_AMBULATORY_CARE_PROVIDER_SITE_OTHER): Payer: BC Managed Care – PPO | Admitting: Family Medicine

## 2022-01-16 VITALS — BP 120/94 | HR 94 | Temp 98.1°F | Ht 67.25 in | Wt 309.0 lb

## 2022-01-16 DIAGNOSIS — Z7689 Persons encountering health services in other specified circumstances: Secondary | ICD-10-CM

## 2022-01-16 DIAGNOSIS — I10 Essential (primary) hypertension: Secondary | ICD-10-CM | POA: Diagnosis not present

## 2022-01-16 NOTE — Patient Instructions (Signed)
It was nice meeting you.  Don't for get to do something for your stress.

## 2022-01-16 NOTE — Progress Notes (Signed)
Patient presents to clinic today to establish care and follow-up on ongoing concerns.  SUBJECTIVE: PMH: Patient is a 46 year old female with PMH SIG for HTN previously seen by Collie Siad, MD.  HTN: -Was taking valsartan 160 mg daily -Seen at UC last week (12/19/2021) for BP 215/100, Norvasc 10 mg added. -BP 114/84, 158/113, 114/84, 129/89, 121/80, 140/94, 133/87, 129/93, 115/86, 135/101, 147/107 -Patient was vegan x2 years, now ovopescatarian -Decreased fast food intake since UC visit -Patient notes family history of HTN in mom and maternal grandparents -Stress may be contributing to elevation in BP.  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  No seizures since then.  IUD in place: -Mirena IUD placed 12/2017  Lactose intolerance  Allergies: NKDA  Past surgical history: Breast biopsy-2022  Social history: Patient and her partner are married.  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  Patient endorses social alcohol use.  Patient denies tobacco use.  Health Maintenance: Dental -- Vision --last eye exam 2021 [facility] [place] Immunizations -- Colonoscopy -- Mammogram --June 2022 PAP --2017 LMP--July 2023 Bone Density --  Family medical history: Mom-Alive, thyroid cancer MGM-deceased, DM 2, CVA MGF-deceased, DM 2   Past Medical History:  Diagnosis Date   Hypertension     No past surgical history on file.  Current Outpatient Medications on File Prior to Visit  Medication Sig Dispense Refill   amLODipine (NORVASC) 10 MG tablet Take 1 tablet (10 mg total) by mouth daily. 30 tablet 1   levonorgestrel (MIRENA) 20 MCG/DAY IUD 1 each by Intrauterine route once.     valsartan (DIOVAN) 160 MG tablet Take 1 tablet (160 mg total) by mouth daily. 30 tablet 1   acetaminophen (TYLENOL) 500 MG tablet Take 2 tablets (1,000 mg total) by mouth every 6 (six) hours as  needed. (Patient not taking: Reported on 01/16/2022) 30 tablet 0   No current facility-administered medications on file prior to visit.    No Known Allergies  Family History  Problem Relation Age of Onset   Breast cancer Neg Hx     Social History   Socioeconomic History   Marital status: Married    Spouse name: Not on file   Number of children: Not on file   Years of education: Not on file   Highest education level: Not on file  Occupational History   Not on file  Tobacco Use   Smoking status: Never   Smokeless tobacco: Never  Substance and Sexual Activity   Alcohol use: Not Currently   Drug use: Yes    Types: Marijuana   Sexual activity: Not on file  Other Topics Concern   Not on file  Social History Narrative   Not on file   Social Determinants of Health   Financial Resource Strain: Not on file  Food Insecurity: Not on file  Transportation Needs: Not on file  Physical Activity: Not on file  Stress: Not on file  Social Connections: Not on file  Intimate Partner Violence: Not on file    ROS General: Denies fever, chills, night sweats, changes in weight, changes in appetite HEENT: Denies headaches, ear pain, changes in vision, rhinorrhea, sore throat CV: Denies CP, palpitations, SOB, orthopnea Pulm: Denies SOB, cough, wheezing GI: Denies abdominal pain, nausea, vomiting, diarrhea, constipation GU: Denies dysuria, hematuria, frequency, vaginal discharge Msk: Denies muscle cramps, joint pains Neuro: Denies weakness, numbness, tingling Skin: Denies  rashes, bruising Psych: Denies depression, anxiety, hallucinations   BP (!) 120/94 (BP Location: Right Arm, Patient Position: Sitting, Cuff Size: Large)   Pulse 94   Temp 98.1 F (36.7 C) (Oral)   Ht 5' 7.25" (1.708 m)   Wt (!) 309 lb (140.2 kg)   LMP 01/09/2022 (Approximate)   SpO2 97%   BMI 48.04 kg/m   Physical Exam Gen. Pleasant, well developed, well-nourished, in NAD HEENT - [place]/AT, PERRL, EOMI,  conjunctive clear, no scleral icterus, no nasal drainage. Lungs: no use of accessory muscles, CTAB, no wheezes, rales or rhonchi Cardiovascular: RRR,  No r/g/m, no peripheral edema Abdomen: BS present, soft, nontender,nondistended. Musculoskeletal: No deformities, moves all four extremities, no cyanosis or clubbing, normal tone Neuro:  A&Ox3, CN II-XII intact, normal gait Skin:  Warm, dry, intact, no lesions  Recent Results (from the past 2160 hour(s))  CBC     Status: Abnormal   Collection Time: 12/19/21 10:45 AM  Result Value Ref Range   WBC 5.3 4.0 - 10.5 K/uL   RBC 4.84 3.87 - 5.11 MIL/uL   Hemoglobin 12.4 12.0 - 15.0 g/dL   HCT 82.5 05.3 - 97.6 %   MCV 78.9 (L) 80.0 - 100.0 fL   MCH 25.6 (L) 26.0 - 34.0 pg   MCHC 32.5 30.0 - 36.0 g/dL   RDW 73.4 19.3 - 79.0 %   Platelets 345 150 - 400 K/uL   nRBC 0.0 0.0 - 0.2 %    Comment: Performed at [facility], [address] [place], [place] [address]  Comprehensive metabolic panel     Status: Abnormal   Collection Time: 12/19/21 10:45 AM  Result Value Ref Range   Sodium 138 135 - 145 mmol/L   Potassium 3.3 (L) 3.5 - 5.1 mmol/L   Chloride 102 98 - 111 mmol/L   CO2 23 22 - 32 mmol/L   Glucose, Bld 82 70 - 99 mg/dL    Comment: Glucose reference range applies only to samples taken after fasting for at least 8 hours.   BUN 7 6 - 20 mg/dL   Creatinine, Ser 3.53 0.44 - 1.00 mg/dL   Calcium 8.8 (L) 8.9 - 10.3 mg/dL   Total Protein 7.7 6.5 - 8.1 g/dL   Albumin 3.5 3.5 - 5.0 g/dL   AST 14 (L) 15 - 41 U/L   ALT 11 0 - 44 U/L   Alkaline Phosphatase 80 38 - 126 U/L   Total Bilirubin 0.8 0.3 - 1.2 mg/dL   GFR, Estimated >29 >92 mL/min    Comment: (NOTE) Calculated using the CKD-EPI Creatinine Equation (2021)    Anion gap 13 5 - 15    Comment: Performed at [facility], [address], [place], [place] [address]    Assessment/Plan: Essential hypertension -Uncontrolled. -BP improving since adding Norvasc 10 mg with  valsartan 150 mg daily -Discussed the importance of lifestyle modifications, including decreasing stress -Continue to monitor BP at home and keep a log to bring with you to clinic -For continued elevation Cecconi greater than 140/90 further medication adjustment.  Encounter to establish care -We reviewed the PMH, PSH, FH, SH, Meds and Allergies. -We provided refills for any medications we will prescribe as needed. -We addressed current concerns per orders and patient instructions. -We have asked for records for pertinent exams, studies, vaccines and notes from previous providers. -We have advised patient to follow up per instructions below.  Morbid obesity (HCC) -Body mass index is 48.04 kg/m. -Lifestyle  modifications encouraged. -Obtain labs such as daily, TSH at next [place] for CPE  F/u in the next few weeks at patient's convenience for CPE, labs, and HTN follow-up.  Grier Mitts, MD

## 2022-02-07 ENCOUNTER — Encounter: Payer: Self-pay | Admitting: Family Medicine

## 2022-02-07 ENCOUNTER — Ambulatory Visit (INDEPENDENT_AMBULATORY_CARE_PROVIDER_SITE_OTHER): Payer: BC Managed Care – PPO | Admitting: Family Medicine

## 2022-02-07 VITALS — BP 124/98 | HR 79 | Temp 98.0°F | Ht 67.5 in | Wt 309.6 lb

## 2022-02-07 DIAGNOSIS — I10 Essential (primary) hypertension: Secondary | ICD-10-CM | POA: Diagnosis not present

## 2022-02-07 DIAGNOSIS — Z0000 Encounter for general adult medical examination without abnormal findings: Secondary | ICD-10-CM

## 2022-02-07 DIAGNOSIS — Z1159 Encounter for screening for other viral diseases: Secondary | ICD-10-CM

## 2022-02-07 DIAGNOSIS — Z1211 Encounter for screening for malignant neoplasm of colon: Secondary | ICD-10-CM

## 2022-02-07 LAB — COMPREHENSIVE METABOLIC PANEL
ALT: 9 U/L (ref 0–35)
AST: 12 U/L (ref 0–37)
Albumin: 3.8 g/dL (ref 3.5–5.2)
Alkaline Phosphatase: 69 U/L (ref 39–117)
BUN: 9 mg/dL (ref 6–23)
CO2: 28 mEq/L (ref 19–32)
Calcium: 8.9 mg/dL (ref 8.4–10.5)
Chloride: 106 mEq/L (ref 96–112)
Creatinine, Ser: 0.81 mg/dL (ref 0.40–1.20)
GFR: 86.21 mL/min (ref >60.00)
Glucose, Bld: 86 mg/dL (ref 70–99)
Potassium: 3.6 mEq/L (ref 3.5–5.1)
Sodium: 140 mEq/L (ref 135–145)
Total Bilirubin: 0.5 mg/dL (ref 0.2–1.2)
Total Protein: 7.4 g/dL (ref 6.0–8.3)

## 2022-02-07 LAB — CBC WITH DIFFERENTIAL/PLATELET
Basophils Absolute: 0 10*3/uL (ref 0.0–0.1)
Basophils Relative: 0.8 % (ref 0.0–3.0)
Eosinophils Absolute: 0.1 10*3/uL (ref 0.0–0.7)
Eosinophils Relative: 1.5 % (ref 0.0–5.0)
HCT: 36.5 % (ref 36.0–46.0)
Hemoglobin: 11.9 g/dL — ABNORMAL LOW (ref 12.0–15.0)
Lymphocytes Relative: 45.5 % (ref 12.0–46.0)
Lymphs Abs: 2.5 10*3/uL (ref 0.7–4.0)
MCHC: 32.6 g/dL (ref 30.0–36.0)
MCV: 79.5 fl (ref 78.0–100.0)
Monocytes Absolute: 0.5 10*3/uL (ref 0.1–1.0)
Monocytes Relative: 9.9 % (ref 3.0–12.0)
Neutro Abs: 2.3 10*3/uL (ref 1.4–7.7)
Neutrophils Relative %: 42.3 % — ABNORMAL LOW (ref 43.0–77.0)
Platelets: 328 10*3/uL (ref 150.0–400.0)
RBC: 4.59 Mil/uL (ref 3.87–5.11)
RDW: 14.5 % (ref 11.5–15.5)
WBC: 5.4 10*3/uL (ref 4.0–10.5)

## 2022-02-07 LAB — LIPID PANEL
Cholesterol: 137 mg/dL (ref 0–200)
HDL: 33.6 mg/dL — ABNORMAL LOW (ref >39.00)
LDL Cholesterol: 85 mg/dL (ref 0–99)
NonHDL: 102.91
Total CHOL/HDL Ratio: 4
Triglycerides: 89 mg/dL (ref 0.0–149.0)
VLDL: 17.8 mg/dL (ref 0.0–40.0)

## 2022-02-07 LAB — TSH: TSH: 2.35 u[IU]/mL (ref 0.35–5.50)

## 2022-02-07 LAB — T4, FREE: Free T4: 0.87 ng/dL (ref 0.60–1.60)

## 2022-02-07 LAB — HEMOGLOBIN A1C: Hgb A1c MFr Bld: 6.3 % (ref 4.6–6.5)

## 2022-02-07 LAB — VITAMIN B12: Vitamin B-12: 345 pg/mL (ref 211–911)

## 2022-02-07 LAB — VITAMIN D 25 HYDROXY (VIT D DEFICIENCY, FRACTURES): VITD: 21.14 ng/mL — ABNORMAL LOW (ref 30.00–100.00)

## 2022-02-07 NOTE — Progress Notes (Signed)
Subjective:     Gretna Bergin is a 46 y.o. female and is here for a comprehensive physical exam. The patient reports doing well.  Pt returned from vacation at the beach and classes have started back at [employer].  Pt notes occasional discomfort/numbness in hand.  Taking Norvasc and valsartan for BP.  Denies headaches, CP, changes in vision.  Social History   Socioeconomic History   Marital status: Married    Spouse name: Not on file   Number of children: Not on file   Years of education: Not on file   Highest education level: Not on file  Occupational History   Not on file  Tobacco Use   Smoking status: Never   Smokeless tobacco: Never  Substance and Sexual Activity   Alcohol use: Not Currently   Drug use: Yes    Types: Marijuana   Sexual activity: Not on file  Other Topics Concern   Not on file  Social History Narrative   Not on file   Social Determinants of Health   Financial Resource Strain: Not on file  Food Insecurity: Not on file  Transportation Needs: Not on file  Physical Activity: Not on file  Stress: Not on file  Social Connections: Not on file  Intimate Partner Violence: Not on file   Health Maintenance  Topic Date Due   HIV Screening  Never done   Hepatitis C Screening  Never done   TETANUS/TDAP  Never done   COLONOSCOPY (Pts 45-68yrs Insurance coverage will need to be confirmed)  Never done   COVID-19 Vaccine (5 - Pfizer series) 06/16/2021   INFLUENZA VACCINE  01/21/2022   PAP SMEAR-Modifier  05/23/2022 (Originally 07/09/2018)   HPV VACCINES  Aged Out    The following portions of the patient's history were reviewed and updated as appropriate: allergies, current medications, past family history, past medical history, past social history, past surgical history, and problem list.  Review of Systems Pertinent items noted in HPI and remainder of comprehensive ROS otherwise negative.   Objective:    BP (!) 124/98 (BP Location: Right Arm, Cuff Size:  Large)   Pulse 79   Temp 98 F (36.7 C) (Oral)   Ht 5' 7.5" (1.715 m)   Wt (!) 309 lb 9.6 oz (140.4 kg)   LMP 01/09/2022 (Approximate)   SpO2 97%   BMI 47.77 kg/m  General appearance: alert, cooperative, and no distress Head: Normocephalic, without obvious abnormality, atraumatic Eyes: conjunctivae/corneas clear. PERRL, EOM's intact. Fundi benign. Ears: normal TM's and external ear canals both ears Nose: Nares normal. Septum midline. Mucosa normal. No drainage or sinus tenderness. Throat: lips, mucosa, and tongue normal; teeth and gums normal Neck: no adenopathy, no carotid bruit, no JVD, supple, symmetrical, trachea midline, and thyroid not enlarged, symmetric, no tenderness/mass/nodules Lungs: clear to auscultation bilaterally Heart: regular rate and rhythm, S1, S2 normal, no murmur, click, rub or gallop Abdomen: soft, non-tender; bowel sounds normal; no masses,  no organomegaly Extremities: extremities normal, atraumatic, no cyanosis or edema Pulses: 2+ and symmetric Skin: Skin color, texture, turgor normal. No rashes or lesions Lymph nodes: Cervical, supraclavicular, and axillary nodes normal. Neurologic: Alert and oriented X 3, normal strength and tone. Normal symmetric reflexes. Normal coordination and gait    Assessment:    Healthy female exam.      Plan:     Well adult exam -Anticipatory guidance given including wearing seatbelts, smoke detectors in the home, increasing physical activity, increasing p.o. intake of water and vegetables. -  Obtain labs -Referral placed for colonoscopy -Mammogram to be scheduled by patient -Immunizations reviewed -Given handouts including one on preventing carpal tunnel.  Discussed ergonomic workspace modifications. -Next CPE in 1 year See After Visit Summary for Counseling Recommendations   Essential hypertension  -Uncontrolled -Continue Norvasc 10 mg daily and valsartan 160 mg daily -Discussed the importance of lifestyle  modifications -Monitor BP at home -For elevation consistently greater than 140/90 increase valsartan/add additional medication. - Plan: CBC with Differential/Platelet, TSH, T4, Free, Lipid panel, CMP  Morbid obesity (HCC)  - Plan: Vitamin D, 25-hydroxy, Vitamin B12, CBC with Differential/Platelet, TSH, T4, Free, Hemoglobin A1c, Lipid panel, CMP  Encounter for hepatitis C screening test for low risk patient  - Plan: Hep C Antibody  Colon cancer screening  - Plan: Ambulatory referral to Gastroenterology  F/u in 4-6 wks for HTN  Abbe Amsterdam, MD

## 2022-02-10 ENCOUNTER — Other Ambulatory Visit: Payer: Self-pay | Admitting: Family Medicine

## 2022-02-10 DIAGNOSIS — E559 Vitamin D deficiency, unspecified: Secondary | ICD-10-CM

## 2022-02-10 LAB — HEPATITIS C ANTIBODY: Hepatitis C Ab: NONREACTIVE

## 2022-02-10 MED ORDER — VITAMIN D (ERGOCALCIFEROL) 1.25 MG (50000 UNIT) PO CAPS: 50000.0000 [IU] | ORAL_CAPSULE | ORAL | 0 refills | Status: DC

## 2022-03-10 ENCOUNTER — Telehealth: Payer: Self-pay | Admitting: Family Medicine

## 2022-03-10 NOTE — Telephone Encounter (Signed)
Called Pt to schedule, no answer. Left a voicemail for Pt to call the office to schedule an OV.

## 2022-03-10 NOTE — Telephone Encounter (Signed)
Pt has been sch for 03-12-2022 and also transferred to  triage nurse bp this morning was 160/108 pt is at work and does not have her bp machine. Please advise

## 2022-03-10 NOTE — Telephone Encounter (Signed)
Pt called to say she has been taken  valsartan (DIOVAN) 160 MG tablet  and BP is more elevated than before and is experiencing more headaches.  LOV: 02/07/22 = CPE  BP 150-160 over 100+  Pt refused to speak to the Triage Nurse and stated her BP is not as bad as it was, so she is going to be ok until MD calls her back.  Please advise.

## 2022-03-12 ENCOUNTER — Ambulatory Visit (INDEPENDENT_AMBULATORY_CARE_PROVIDER_SITE_OTHER): Payer: BC Managed Care – PPO | Admitting: Family Medicine

## 2022-03-12 ENCOUNTER — Encounter: Payer: Self-pay | Admitting: Family Medicine

## 2022-03-12 VITALS — BP 138/84 | HR 90 | Temp 99.5°F | Ht 67.5 in | Wt 312.6 lb

## 2022-03-12 DIAGNOSIS — Z23 Encounter for immunization: Secondary | ICD-10-CM | POA: Diagnosis not present

## 2022-03-12 DIAGNOSIS — I10 Essential (primary) hypertension: Secondary | ICD-10-CM | POA: Diagnosis not present

## 2022-03-12 MED ORDER — METOPROLOL SUCCINATE ER 25 MG PO TB24: 25.0000 mg | ORAL_TABLET | Freq: Every day | ORAL | 3 refills | Status: DC

## 2022-03-12 NOTE — Progress Notes (Signed)
Subjective:    Patient ID: Courtney Nelson, female    DOB: [birth year], 46 y.o.   MRN: [id number]  Chief Complaint  Patient presents with   Hypertension    Patient states her blood pressure reading was 160/109 today at home    HPI Patient was seen today for f/u on HTN.  Pt was taking Norvasv 10 mg and valsartan 160 mg with some improvement in bp.  At last OFV, valsartan increased to 320 mg and Norvasc held.  BP seemed to increase, 150s-160s/100s at home.  Pt developed HAs, but not as bad as over the summer prior to starting bp meds.  Pt making diet changes, she and her partner are not cooking with salt.  Pt interested in influenza and tdap vaccines.  Inquires about new COVID-19 vaccine.  Past Medical History:  Diagnosis Date   Hypertension     No Known Allergies  ROS General: Denies fever, chills, night sweats, changes in weight, changes in appetite HEENT: Denies headaches, ear pain, changes in vision, rhinorrhea, sore throat +HAs CV: Denies CP, palpitations, SOB, orthopnea Pulm: Denies SOB, cough, wheezing GI: Denies abdominal pain, nausea, vomiting, diarrhea, constipation GU: Denies dysuria, hematuria, frequency, vaginal discharge Msk: Denies muscle cramps, joint pains Neuro: Denies weakness, numbness, tingling Skin: Denies rashes, bruising Psych: Denies depression, anxiety, hallucinations     Objective:    Blood pressure 138/84, pulse 90, temperature 99.5 F (37.5 C), temperature source Oral, height 5' 7.5" (1.715 m), weight (!) 312 lb 9.6 oz (141.8 kg), SpO2 98 %.  Gen. Pleasant, well-nourished, in no distress, normal affect   HEENT: /AT, face symmetric, conjunctiva clear, no scleral icterus, PERRLA, EOMI, nares patent without drainage Lungs: no accessory muscle use Cardiovascular: RRR, no peripheral edema Neuro:  A&Ox3, CN II-XII intact, normal gait Skin:  Warm, no lesions/ rash   Wt Readings from Last 3 Encounters:  03/12/22 (!) 312 lb 9.6 oz (141.8 kg)   02/07/22 (!) 309 lb 9.6 oz (140.4 kg)  01/16/22 (!) 309 lb (140.2 kg)   BP Readings from Last 3 Encounters:  03/12/22 138/84  02/07/22 (!) 124/98  01/16/22 (!) 120/94    Lab Results  Component Value Date   WBC 5.4 02/07/2022   HGB 11.9 (L) 02/07/2022   HCT 36.5 02/07/2022   PLT 328.0 02/07/2022   GLUCOSE 86 02/07/2022   CHOL 137 02/07/2022   TRIG 89.0 02/07/2022   HDL 33.60 (L) 02/07/2022   LDLCALC 85 02/07/2022   ALT 9 02/07/2022   AST 12 02/07/2022   NA 140 02/07/2022   K 3.6 02/07/2022   CL 106 02/07/2022   CREATININE 0.81 02/07/2022   BUN 9 02/07/2022   CO2 28 02/07/2022   TSH 2.35 02/07/2022   HGBA1C 6.3 02/07/2022    Assessment/Plan:  Essential hypertension -controlled in clinic -given elevation at home discussed adjusting medication. -will d/c valsartan 320 mg daily. -start Toprol XL 25 mg daily.  R/b/a discussed. -advised will likely need to adjust bp med further.  If needed can also restart norvasc. -continue to monitor bp at home and notify clinic of readings consistently > 140/90 -given precautions.  - Plan: metoprolol succinate (TOPROL-XL) 25 MG 24 hr tablet  Need for immunization against influenza  - Plan: Flu Vaccine QUAD 6+ mos PF IM (Fluarix Quad PF)  Need for Tdap vaccination  - Plan: Tdap vaccine greater than or equal to 7yo IM  F/u in 2-4 wks  Grier Mitts, MD

## 2022-03-12 NOTE — Telephone Encounter (Signed)
Scheduled for OV today.

## 2022-03-19 ENCOUNTER — Encounter: Payer: Self-pay | Admitting: Family Medicine

## 2022-04-02 ENCOUNTER — Other Ambulatory Visit: Payer: Self-pay | Admitting: Family Medicine

## 2022-04-05 ENCOUNTER — Ambulatory Visit (HOSPITAL_COMMUNITY)
Admission: EM | Admit: 2022-04-05 | Discharge: 2022-04-05 | Disposition: A | Discharge status: Home / Self Care | Payer: BC Managed Care – PPO | Attending: Physician Assistant | Admitting: Physician Assistant

## 2022-04-05 ENCOUNTER — Encounter (HOSPITAL_COMMUNITY): Payer: Self-pay

## 2022-04-05 DIAGNOSIS — I10 Essential (primary) hypertension: Secondary | ICD-10-CM

## 2022-04-05 MED ORDER — AMLODIPINE BESYLATE 5 MG PO TABS: 5.0000 mg | ORAL_TABLET | Freq: Every day | ORAL | 2 refills | Status: DC

## 2022-04-05 NOTE — ED Triage Notes (Signed)
Patient having elevated BP, feeling flushed, headaches for a week.  No dizziness, blurred vision, nausea or vomiting.  Patient states her medications were changed. Has been on Losartan and Amlodipine. Patient is now on Metoprolol only. Onset 2 weeks ago.

## 2022-04-05 NOTE — Discharge Instructions (Signed)
Advised to combine amlodipine 5 mg daily with a metoprolol ER 25 mg. Advised to give the combination blood pressure medicine about 2 to 3 weeks to see how effective it is at lowering the blood pressure and if goal is not reached to consider increasing the amlodipine to 10 mg daily combined with the metoprolol ER 25 mg. Advised to follow-up with PCP or return to urgent care if symptoms fail to improve.

## 2022-04-05 NOTE — ED Provider Notes (Signed)
[facility]    CSN: [id number] Arrival date & time: 04/05/22  1711      History   Chief Complaint Chief Complaint  Patient presents with   Hypertension   Headache    [name] is a 46 y.o. female.   46 year old female presents with hypertension.  Patient indicates that she was taking losartan and amlodipine and this was controlling her blood pressure at a level of 123/80.  She relates she does not have any problems or side effects from the medication however her present PCP decided to convert her to metoprolol ER 25 mg daily.  She relates that she has been on this particular dose of the metoprolol for a couple of weeks and her blood pressure is elevated and not controlled.  She relates that she has been having headaches on a daily basis, with some mild intermittent dizziness.  She indicates she is not having any chest pain, shortness of breath, nausea or vomiting.  She is concerned that her blood pressure has remained high and she continues to have headaches.  She desires to have amlodipine added back to the metoprolol since this medicine seem to do better at controlling her blood pressure and she had no problems or side effects from it.  She relates she does have an appointment to see another PCP but it is not until January 2024.   Hypertension Associated symptoms include headaches.  Headache   Past Medical History:  Diagnosis Date   Hypertension     There are no problems to display for this patient.   History reviewed. No pertinent surgical history.  OB History   No obstetric history on file.      Home Medications    Prior to Admission medications   Medication Sig Start Date End Date Taking? Authorizing Provider  amLODipine (NORVASC) 5 MG tablet Take 1 tablet (5 mg total) by mouth daily. 04/05/22  Yes Ellsworth Lennox, PA-C  levonorgestrel (MIRENA) 20 MCG/DAY IUD 1 each by Intrauterine route once.   Yes [provider]  metoprolol  succinate (TOPROL-XL) 25 MG 24 hr tablet Take 1 tablet (25 mg total) by mouth daily. 03/12/22  Yes Deeann Saint, MD  Vitamin D, Ergocalciferol, (DRISDOL) 1.25 MG (50000 UNIT) CAPS capsule Take 1 capsule (50,000 Units total) by mouth every 7 (seven) days. 02/10/22  Yes Deeann Saint, MD  acetaminophen (TYLENOL) 500 MG tablet Take 2 tablets (1,000 mg total) by mouth every 6 (six) hours as needed. 12/19/21   Carlisle Beers, FNP    Family History Family History  Problem Relation Age of Onset   Breast cancer Neg Hx     Social History Social History   Tobacco Use   Smoking status: Never   Smokeless tobacco: Never  Substance Use Topics   Alcohol use: Not Currently   Drug use: Yes    Types: Marijuana     Allergies   Patient has no known allergies.   Review of Systems Review of Systems  Neurological:  Positive for headaches.     Physical Exam Triage Vital Signs ED Triage Vitals  Enc Vitals Group     BP 04/05/22 1759 (!) 175/122     Pulse Rate 04/05/22 1800 86     Resp 04/05/22 1800 18     Temp 04/05/22 1759 (!) 97.5 F (36.4 C)     Temp Source 04/05/22 1759 Oral     SpO2 04/05/22 1759 99 %     Weight --  Height --      Head Circumference --      Peak Flow --      Pain Score 04/05/22 1744 7     Pain Loc --      Pain Edu? --      Excl. in [place]? --    No data found.  Updated Vital Signs BP (!) 175/122 (BP Location: Left Arm)   Pulse 86   Temp (!) 97.5 F (36.4 C) (Oral)   Resp 18   LMP  (LMP Unknown)   SpO2 99%   Visual Acuity Right Eye Distance:   Left Eye Distance:   Bilateral Distance:    Right Eye Near:   Left Eye Near:    Bilateral Near:     Physical Exam Constitutional:      Appearance: She is well-developed.  Cardiovascular:     Rate and Rhythm: Normal rate and regular rhythm.     Heart sounds: Normal heart sounds.  Pulmonary:     Effort: Pulmonary effort is normal.     Breath sounds: Normal breath sounds and air entry. No  wheezing, rhonchi or rales.  Neurological:     Mental Status: She is alert and oriented to person, place, and time.     Cranial Nerves: Cranial nerves 2-12 are intact.     Motor: Motor function is intact.     Gait: Gait is intact.      UC Treatments / Results  Labs (all labs ordered are listed, but only abnormal results are displayed) Labs Reviewed - No data to display  EKG   Radiology No results found.  Procedures Procedures (including critical care time)  Medications Ordered in UC Medications - No data to display  Initial Impression / Assessment and Plan / UC Course  I have reviewed the triage vital signs and the nursing notes.  Pertinent labs & imaging results that were available during my care of the patient were reviewed by me and considered in my medical decision making (see chart for details).    Plan: 1.  The hypertension will be treated with the following: A.  The patient will continue to take the metoprolol ER 25 mg daily to control blood pressure. B.  Amlodipine 5 mg will be added to the medication regimen and she is advised to take 1 daily to help gain blood pressure control. C.  The patient has been instructed to have the blood pressure rechecked in about 2 to 3 weeks, and if the blood pressure is not at an acceptable level then the amlodipine should be increased to 10 mg daily combined with the metoprolol ER 25 mg. 2.  Patient advised to follow-up with PCP or return to urgent care as needed. Final Clinical Impressions(s) / UC Diagnoses   Final diagnoses:  Essential hypertension     Discharge Instructions      Advised to combine amlodipine 5 mg daily with a metoprolol ER 25 mg. Advised to give the combination blood pressure medicine about 2 to 3 weeks to see how effective it is at lowering the blood pressure and if goal is not reached to consider increasing the amlodipine to 10 mg daily combined with the metoprolol ER 25 mg. Advised to follow-up with  PCP or return to urgent care if symptoms fail to improve.    ED Prescriptions     Medication Sig Dispense Auth. Provider   amLODipine (NORVASC) 5 MG tablet Take 1 tablet (5 mg total) by mouth daily. 30 tablet  Ellsworth Lennox, PA-C      PDMP not reviewed this encounter.   Ellsworth Lennox, PA-C 04/05/22 1843

## 2022-05-07 ENCOUNTER — Telehealth: Payer: Self-pay

## 2022-05-07 NOTE — Telephone Encounter (Signed)
ATC pt to check on BP readings, no answer. LM to call office.

## 2022-07-07 ENCOUNTER — Encounter: Payer: Self-pay | Admitting: Nurse Practitioner

## 2022-07-07 ENCOUNTER — Ambulatory Visit: Payer: BC Managed Care – PPO | Attending: Nurse Practitioner | Admitting: Nurse Practitioner

## 2022-07-07 VITALS — BP 131/91 | HR 100 | Ht 67.5 in | Wt 313.2 lb

## 2022-07-07 DIAGNOSIS — I10 Essential (primary) hypertension: Secondary | ICD-10-CM | POA: Diagnosis not present

## 2022-07-07 DIAGNOSIS — Z124 Encounter for screening for malignant neoplasm of cervix: Secondary | ICD-10-CM

## 2022-07-07 DIAGNOSIS — Z1211 Encounter for screening for malignant neoplasm of colon: Secondary | ICD-10-CM

## 2022-07-07 DIAGNOSIS — Z1231 Encounter for screening mammogram for malignant neoplasm of breast: Secondary | ICD-10-CM | POA: Diagnosis not present

## 2022-07-07 MED ORDER — AMLODIPINE BESYLATE 10 MG PO TABS: 10.0000 mg | ORAL_TABLET | Freq: Every day | ORAL | 1 refills | Status: DC

## 2022-07-07 MED ORDER — METOPROLOL SUCCINATE ER 25 MG PO TB24: 25.0000 mg | ORAL_TABLET | Freq: Every day | ORAL | 1 refills | Status: DC

## 2022-07-07 NOTE — Progress Notes (Signed)
Assessment & Plan:  Courtney Nelson was seen today for establish care.  Diagnoses and all orders for this visit:  Essential hypertension DOSE CHANGE-     amLODipine (NORVASC) 10 MG tablet; Take 1 tablet (10 mg total) by mouth daily. -     metoprolol succinate (TOPROL-XL) 25 MG 24 hr tablet; Take 1 tablet (25 mg total) by mouth daily. Continue all antihypertensives as prescribed.  Reminded to bring in blood pressure log for follow  up appointment.  RECOMMENDATIONS: DASH/Mediterranean Diets are healthier choices for HTN.    Breast cancer screening by mammogram -     MM 3D SCREEN BREAST BILATERAL; Future  Morbid obesity  Discussed diet and exercise for person with BMI >48. Instructed: You must burn more calories than you eat. Losing 5 percent of your body weight should be considered a success. In the longer term, losing more than 15 percent of your body weight and staying at this weight is an extremely good result. However, keep in mind that even losing 5 percent of your body weight leads to important health benefits, so try not to get discouraged if you're not able to lose more than this.   Encounter for Papanicolaou smear for cervical cancer screening -     Ambulatory referral to Gynecology  Colon cancer screening -     Ambulatory referral to Gastroenterology    Patient has been counseled on age-appropriate routine health concerns for screening and prevention. These are reviewed and up-to-date. Referrals have been placed accordingly. Immunizations are up-to-date or declined.    Subjective:   Chief Complaint  Patient presents with   Establish Care   [name] 47 y.o. female presents to office today to establish care.  Patient has been counseled on age-appropriate routine health concerns for screening and prevention. These are reviewed and up-to-date. Referrals have been placed accordingly. Immunizations are up-to-date or declined.     MAMMOGRAM: OVERDUE: Referral  placed PAP SMEAR: OVERDUE. Referral placed COLONOSCOPY: OVERDUE. Referral placed   HTN Blood pressure is elevated today. Valsartan 320mg  daily was ineffective.  She did well with amlodipine 10 mg daily in the past. Currently taking amlodipine 5 mg which was just recently restarted.  BP Readings from Last 3 Encounters:  07/07/22 (!) 131/91  04/05/22 (!) 175/122  03/12/22 138/84     Review of Systems  Constitutional:  Negative for fever, malaise/fatigue and weight loss.  HENT: Negative.  Negative for nosebleeds.   Eyes: Negative.  Negative for blurred vision, double vision and photophobia.  Respiratory: Negative.  Negative for cough and shortness of breath.   Cardiovascular: Negative.  Negative for chest pain, palpitations and leg swelling.  Gastrointestinal: Negative.  Negative for heartburn, nausea and vomiting.  Musculoskeletal: Negative.  Negative for myalgias.  Neurological: Negative.  Negative for dizziness, focal weakness, seizures and headaches.  Psychiatric/Behavioral: Negative.  Negative for suicidal ideas.     Past Medical History:  Diagnosis Date   Hypertension     History reviewed. No pertinent surgical history.  Family History  Problem Relation Age of Onset   Breast cancer Neg Hx     Social History Reviewed with no changes to be made today.   Outpatient Medications Prior to Visit  Medication Sig Dispense Refill   acetaminophen (TYLENOL) 500 MG tablet Take 2 tablets (1,000 mg total) by mouth every 6 (six) hours as needed. 30 tablet 0   levonorgestrel (MIRENA) 20 MCG/DAY IUD 1 each by Intrauterine route once.     amLODipine (  NORVASC) 5 MG tablet Take 1 tablet (5 mg total) by mouth daily. 30 tablet 2   metoprolol succinate (TOPROL-XL) 25 MG 24 hr tablet Take 1 tablet (25 mg total) by mouth daily. 30 tablet 3   Vitamin D, Ergocalciferol, (DRISDOL) 1.25 MG (50000 UNIT) CAPS capsule Take 1 capsule (50,000 Units total) by mouth every 7 (seven) days. (Patient not  taking: Reported on 07/07/2022) 12 capsule 0   No facility-administered medications prior to visit.    No Known Allergies     Objective:    BP (!) 131/91 Comment: [facility] machine read at 143/99  Pulse 100   Ht 5' 7.5" (1.715 m)   Wt (!) 313 lb 3.2 oz (142.1 kg)   SpO2 99%   BMI 48.33 kg/m  Wt Readings from Last 3 Encounters:  07/07/22 (!) 313 lb 3.2 oz (142.1 kg)  03/12/22 (!) 312 lb 9.6 oz (141.8 kg)  02/07/22 (!) 309 lb 9.6 oz (140.4 kg)    Physical Exam Vitals and nursing note reviewed.  Constitutional:      Appearance: She is well-developed.  HENT:     Head: Normocephalic and atraumatic.  Cardiovascular:     Rate and Rhythm: Normal rate and regular rhythm.     Heart sounds: Normal heart sounds. No murmur heard.    No friction rub. No gallop.  Pulmonary:     Effort: Pulmonary effort is normal. No tachypnea or respiratory distress.     Breath sounds: Normal breath sounds. No decreased breath sounds, wheezing, rhonchi or rales.  Chest:     Chest wall: No tenderness.  Abdominal:     General: Bowel sounds are normal.     Palpations: Abdomen is soft.  Musculoskeletal:        General: Normal range of motion.     Cervical back: Normal range of motion.  Skin:    General: Skin is warm and dry.  Neurological:     Mental Status: She is alert and oriented to person, place, and time.     Coordination: Coordination normal.  Psychiatric:        Behavior: Behavior normal. Behavior is cooperative.        Thought Content: Thought content normal.        Judgment: Judgment normal.          Patient has been counseled extensively about nutrition and exercise as well as the importance of adherence with medications and regular follow-up. The patient was given clear instructions to go to ER or return to medical center if symptoms don't improve, worsen or new problems develop. The patient verbalized understanding.   Follow-up: Return in about 3 weeks (around 07/28/2022) for  virtual visit double book 1110 or 130 for BP.   [name], FNP-BC [facility] [place], [place]   07/07/2022, 2:13 PM

## 2022-07-28 ENCOUNTER — Encounter: Payer: Self-pay | Admitting: Nurse Practitioner

## 2022-07-28 ENCOUNTER — Telehealth (HOSPITAL_BASED_OUTPATIENT_CLINIC_OR_DEPARTMENT_OTHER): Payer: BC Managed Care – PPO | Admitting: Nurse Practitioner

## 2022-07-28 DIAGNOSIS — I10 Essential (primary) hypertension: Secondary | ICD-10-CM

## 2022-07-28 MED ORDER — CARVEDILOL 6.25 MG PO TABS: 6.2500 mg | ORAL_TABLET | Freq: Two times a day (BID) | ORAL | 3 refills | Status: DC

## 2022-07-28 NOTE — Progress Notes (Signed)
Virtual Visit Note  I discussed the limitations, risks, security and privacy concerns of performing an evaluation and management service by video and the availability of in person appointments. I also discussed with the patient that there may be a patient responsible charge related to this service. The patient expressed understanding and agreed to proceed.    I connected with Courtney Nelson on 07/28/22  at   1:30 PM EST  EDT by VIDEO and verified that I am speaking with the correct person using two identifiers.   Location of Patient: Private Residence   Location of Provider: [place] participating in VIRTUAL visit: Courtney Rankins FNP-BC [place]   History of Present Illness: VIRTUAL visit for: Hypertension  [name] was started on amlodipine 10 mg a few weeks ago for hypertension.  At that time she was already taking Toprol-XL 25 mg daily.  At this time based on her home readings she will continue her on amlodipine 10 mg daily and switch from Toprol-XL to carvedilol 6.25 mg twice daily. 121/92 142/105 143/99 139/109 145/98 145/104 139/98  Average blood pressure 139/100 Average heart rate 97-99 Past Medical History:  Diagnosis Date   Hypertension     History reviewed. No pertinent surgical history.  Family History  Problem Relation Age of Onset   Breast cancer Neg Hx     Social History   Socioeconomic History   Marital status: Married    Spouse name: Not on file   Number of children: Not on file   Years of education: Not on file   Highest education level: Not on file  Occupational History   Not on file  Tobacco Use   Smoking status: Never   Smokeless tobacco: Never  Vaping Use   Vaping Use: Never used  Substance and Sexual Activity   Alcohol use: Yes    Comment: occ   Drug use: Yes    Types: Marijuana   Sexual activity: Not on file  Other Topics Concern   Not on file  Social History  Narrative   Not on file   Social Determinants of Health   Financial Resource Strain: Not on file  Food Insecurity: Not on file  Transportation Needs: Not on file  Physical Activity: Not on file  Stress: Not on file  Social Connections: Not on file     Observations/Objective: Awake, alert and oriented x 3   Review of Systems  Constitutional:  Negative for fever, malaise/fatigue and weight loss.  HENT: Negative.  Negative for nosebleeds.   Eyes: Negative.  Negative for blurred vision, double vision and photophobia.  Respiratory: Negative.  Negative for cough and shortness of breath.   Cardiovascular: Negative.  Negative for chest pain, palpitations and leg swelling.  Gastrointestinal: Negative.  Negative for heartburn, nausea and vomiting.  Musculoskeletal: Negative.  Negative for myalgias.  Neurological: Negative.  Negative for dizziness, focal weakness, seizures and headaches.  Psychiatric/Behavioral: Negative.  Negative for suicidal ideas.     Assessment and Plan: Diagnoses and all orders for this visit:  Primary hypertension -     carvedilol (COREG) 6.25 MG tablet; Take 1 tablet (6.25 mg total) by mouth 2 (two) times daily with a meal. Continue amlodipine 10 mg daily as prescribed.  Stop metoprolol    Follow Up Instructions Return in about 3 weeks (around 08/18/2022) for BP recheck.     I discussed the assessment and treatment plan with the patient. The patient was provided  an opportunity to ask questions and all were answered. The patient agreed with the plan and demonstrated an understanding of the instructions.   The patient was advised to call back or seek an in-person evaluation if the symptoms worsen or if the condition fails to improve as anticipated.  I provided 11 minutes of face-to-face time during this encounter including median intraservice time, reviewing previous notes, labs, imaging, medications and explaining diagnosis and management.  Courtney Pounds,  FNP-BC

## 2022-07-29 ENCOUNTER — Ambulatory Visit (INDEPENDENT_AMBULATORY_CARE_PROVIDER_SITE_OTHER): Payer: BC Managed Care – PPO | Admitting: Nurse Practitioner

## 2022-07-29 ENCOUNTER — Encounter: Payer: Self-pay | Admitting: Nurse Practitioner

## 2022-07-29 ENCOUNTER — Other Ambulatory Visit (HOSPITAL_COMMUNITY)
Admission: RE | Admit: 2022-07-29 | Discharge: 2022-07-29 | Disposition: A | Discharge status: Home / Self Care | Payer: BC Managed Care – PPO | Source: Ambulatory Visit | Attending: Nurse Practitioner | Admitting: Nurse Practitioner

## 2022-07-29 VITALS — BP 114/80 | HR 99 | Ht 66.75 in | Wt 312.0 lb

## 2022-07-29 DIAGNOSIS — Z124 Encounter for screening for malignant neoplasm of cervix: Secondary | ICD-10-CM

## 2022-07-29 DIAGNOSIS — Z01419 Encounter for gynecological examination (general) (routine) without abnormal findings: Secondary | ICD-10-CM | POA: Diagnosis present

## 2022-07-29 DIAGNOSIS — Z30431 Encounter for routine checking of intrauterine contraceptive device: Secondary | ICD-10-CM

## 2022-07-29 NOTE — Progress Notes (Signed)
   Courtney Nelson [birth year] [id number]   History:  47 y.o. G0 presents as new patient to establish care. Amenorrheic, Mirena IUD inserted 12/2017 for heavy menses. Normal pap history. HTN managed by PCP. Established with PCP 07/07/22, mammogram and colonoscopy referrals placed.  Gynecologic History No LMP recorded. (Menstrual status: IUD). Period Pattern:  (no bleeding with mirena iud) Contraception/Family planning: IUD Sexually active: Yes, same sex partner  Health Maintenance Last Pap: 07/10/2015. Results were: Normal neg HPV Last mammogram: 07/19/2020. Results were: Possible right breast mass and adenopathy. 11/2020 follow up - benign biopsy of axillary lymph node Last colonoscopy: Never Last Dexa: Not indicated  Past medical history, past surgical history, family history and social history were all reviewed and documented in the EPIC chart. Married. 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 Wife is Environmental health practitioner at [employer].   ROS:  A ROS was performed and pertinent positives and negatives are included.  Exam:  Vitals:   07/29/22 1022  BP: 114/80  Pulse: 99  SpO2: 99%  Weight: (!) 312 lb (141.5 kg)  Height: 5' 6.75" (1.695 m)   Body mass index is 49.23 kg/m.  General appearance:  Normal Thyroid:  Symmetrical, normal in size, without palpable masses or nodularity. Respiratory  Auscultation:  Clear without wheezing or rhonchi Cardiovascular  Auscultation:  Regular rate, without rubs, murmurs or gallops  Edema/varicosities:  Not grossly evident Abdominal  Soft,nontender, without masses, guarding or rebound.  Liver/spleen:  No organomegaly noted  Hernia:  None appreciated  Skin  Inspection:  Grossly normal Breasts: Examined lying and sitting.   Right: Without masses, retractions, nipple discharge or axillary adenopathy.   Left: Without masses, retractions, nipple discharge or axillary adenopathy. Genitourinary   Inguinal/mons:  Normal without inguinal  adenopathy  External genitalia:  Normal appearing vulva with no masses, tenderness, or lesions  BUS/Urethra/Skene's glands:  Normal  Vagina:  Normal appearing with normal color and discharge, no lesions  Cervix:  Normal appearing without discharge or lesions. IUD string visible  Uterus:  Difficult to palpate due to body habitus but no gross masses or tenderness  Adnexa/parametria:     Rt: Normal in size, without masses or tenderness.   Lt: Normal in size, without masses or tenderness.  Anus and perineum: Normal  Digital rectal exam: Deferred  Patient informed chaperone available to be present for breast and pelvic exam. Patient has requested no chaperone to be present. Patient has been advised what will be completed during breast and pelvic exam.   Assessment/Plan:  47 y.o. G0 to establish care.   Well female exam with routine gynecological exam - Education provided on SBEs, importance of preventative screenings, current guidelines, high calcium diet, regular exercise, and multivitamin daily. Labs with PCP.   Encounter for routine checking of intrauterine contraceptive device (IUD) - Mirena IUD inserted 12/2017 for heavy menses. Amenorrheic. Normal exam. Aware of 8-year FDA approval.   Encounter for Papanicolaou smear for cervical cancer screening - Plan: Cytology - PAP( [facility]). Normal pap history.   Screening for breast cancer - 11/2020 benign biopsy of lymph node. Overdue. Referral placed by PCP. Normal breast exam today.  Screening for colon cancer - Has not had screening. Referral placed by PCP.   Return in 1 year for annual.     Tamela Gammon DNP, 10:50 AM 07/29/2022

## 2022-07-30 ENCOUNTER — Other Ambulatory Visit (HOSPITAL_COMMUNITY): Payer: Self-pay

## 2022-07-31 LAB — CYTOLOGY - PAP
Comment: NEGATIVE
Diagnosis: NEGATIVE
High risk HPV: NEGATIVE

## 2022-08-18 ENCOUNTER — Ambulatory Visit: Payer: BC Managed Care – PPO | Admitting: Nurse Practitioner

## 2022-09-17 ENCOUNTER — Encounter: Payer: Self-pay | Admitting: Nurse Practitioner

## 2022-09-17 ENCOUNTER — Ambulatory Visit: Payer: BC Managed Care – PPO | Attending: Nurse Practitioner | Admitting: Nurse Practitioner

## 2022-09-17 VITALS — BP 129/85 | HR 74 | Ht 66.75 in | Wt 316.4 lb

## 2022-09-17 DIAGNOSIS — I10 Essential (primary) hypertension: Secondary | ICD-10-CM

## 2022-09-17 DIAGNOSIS — R7303 Prediabetes: Secondary | ICD-10-CM | POA: Diagnosis not present

## 2022-09-17 MED ORDER — AMLODIPINE BESYLATE 10 MG PO TABS: 10.0000 mg | ORAL_TABLET | Freq: Every day | ORAL | 1 refills | Status: DC

## 2022-09-17 MED ORDER — CARVEDILOL 6.25 MG PO TABS: 6.2500 mg | ORAL_TABLET | Freq: Two times a day (BID) | ORAL | 1 refills | Status: DC

## 2022-09-17 NOTE — Progress Notes (Signed)
Assessment & Plan:  Courtney Nelson was seen today for hypertension.  Diagnoses and all orders for this visit:  Primary hypertension -     carvedilol (COREG) 6.25 MG tablet; Take 1 tablet (6.25 mg total) by mouth 2 (two) times daily with a meal. -     amLODipine (NORVASC) 10 MG tablet; Take 1 tablet (10 mg total) by mouth daily. -     CMP14+EGFR Continue all antihypertensives as prescribed.  Reminded to bring in blood pressure log for follow  up appointment.  RECOMMENDATIONS: DASH/Mediterranean Diets are healthier choices for HTN.    Prediabetes -     CMP14+EGFR -     Hemoglobin A1c    Patient has been counseled on age-appropriate routine health concerns for screening and prevention. These are reviewed and up-to-date. Referrals have been placed accordingly. Immunizations are up-to-date or declined.    Subjective:   Chief Complaint  Patient presents with   Hypertension   Hypertension Pertinent negatives include no blurred vision, chest pain, headaches, malaise/fatigue, palpitations or shortness of breath.   Courtney Nelson 47 y.o. female presents to office today for follow up to HTN  Patient has been counseled on age-appropriate routine health concerns for screening and prevention. These are reviewed and up-to-date. Referrals have been placed accordingly. Immunizations are up-to-date or declined.     MAMMOGRAM: OVERDUE: Referral placed PAP SMEAR: UTD COLONOSCOPY: OVERDUE. Referral placed   Blood pressure is well controlled today.  She is currently taking amlodipine 10 mg daily and carvedilol 6.25 mg BID. Tolerating both very well. She has her home blood pressure monitor with her today.  Diastolic blood pressure is 15 points higher than our reading.  The manual reading today was 128/76 BP Readings from Last 3 Encounters:  09/17/22 129/85  07/29/22 114/80  07/07/22 (!) 131/91     Review of Systems  Constitutional:  Negative for fever, malaise/fatigue and weight loss.   HENT: Negative.  Negative for nosebleeds.   Eyes: Negative.  Negative for blurred vision, double vision and photophobia.  Respiratory: Negative.  Negative for cough and shortness of breath.   Cardiovascular: Negative.  Negative for chest pain, palpitations and leg swelling.  Gastrointestinal: Negative.  Negative for heartburn, nausea and vomiting.  Musculoskeletal: Negative.  Negative for myalgias.  Neurological: Negative.  Negative for dizziness, focal weakness, seizures and headaches.  Psychiatric/Behavioral: Negative.  Negative for suicidal ideas.     Past Medical History:  Diagnosis Date   Anemia    Anxiety    Hypertension     Past Surgical History:  Procedure Laterality Date   mirena insertion     inserted 12-21-17    Family History  Problem Relation Age of Onset   Hypertension Mother    Diabetes Mother    Thyroid cancer Mother    Stroke Maternal Grandmother    Diabetes Maternal Grandmother    Hypertension Maternal Grandfather    Diabetes Maternal Grandfather     Social History Reviewed with no changes to be made today.   Outpatient Medications Prior to Visit  Medication Sig Dispense Refill   acetaminophen (TYLENOL) 500 MG tablet Take 2 tablets (1,000 mg total) by mouth every 6 (six) hours as needed. 30 tablet 0   levonorgestrel (MIRENA) 20 MCG/DAY IUD 1 each by Intrauterine route once.     amLODipine (NORVASC) 10 MG tablet Take 1 tablet (10 mg total) by mouth daily. 90 tablet 1   carvedilol (COREG) 6.25 MG tablet Take 1 tablet (6.25 mg total) by  mouth 2 (two) times daily with a meal. 60 tablet 3   No facility-administered medications prior to visit.    No Known Allergies     Objective:    BP 129/85 Comment: Patients BP Cuff : 134/100  Pulse 74   Ht 5' 6.75" (1.695 m)   Wt (!) 316 lb 6.4 oz (143.5 kg)   LMP  (Approximate) Comment: 3 months ago  SpO2 (!) 9%   BMI 49.93 kg/m  Wt Readings from Last 3 Encounters:  09/17/22 (!) 316 lb 6.4 oz (143.5 kg)   07/29/22 (!) 312 lb (141.5 kg)  07/07/22 (!) 313 lb 3.2 oz (142.1 kg)    Physical Exam Vitals and nursing note reviewed.  Constitutional:      Appearance: She is well-developed.  HENT:     Head: Normocephalic and atraumatic.  Cardiovascular:     Rate and Rhythm: Normal rate and regular rhythm.     Heart sounds: Normal heart sounds. No murmur heard.    No friction rub. No gallop.  Pulmonary:     Effort: Pulmonary effort is normal. No tachypnea or respiratory distress.     Breath sounds: Normal breath sounds. No decreased breath sounds, wheezing, rhonchi or rales.  Chest:     Chest wall: No tenderness.  Abdominal:     General: Bowel sounds are normal.     Palpations: Abdomen is soft.  Musculoskeletal:        General: Normal range of motion.     Cervical back: Normal range of motion.  Skin:    General: Skin is warm and dry.  Neurological:     Mental Status: She is alert and oriented to person, place, and time.     Coordination: Coordination normal.  Psychiatric:        Behavior: Behavior normal. Behavior is cooperative.        Thought Content: Thought content normal.        Judgment: Judgment normal.          Patient has been counseled extensively about nutrition and exercise as well as the importance of adherence with medications and regular follow-up. The patient was given clear instructions to go to ER or return to medical center if symptoms don't improve, worsen or new problems develop. The patient verbalized understanding.   Follow-up: Return in about 3 months (around 12/18/2022).   [name], FNP-BC [facility] and [facility], [place]   09/17/2022, 9:51 AM

## 2022-09-18 LAB — CMP14+EGFR
ALT: 9 IU/L (ref 0–32)
AST: 13 IU/L (ref 0–40)
Albumin/Globulin Ratio: 1.1 — ABNORMAL LOW (ref 1.2–2.2)
Albumin: 3.7 g/dL — ABNORMAL LOW (ref 3.9–4.9)
Alkaline Phosphatase: 84 IU/L (ref 44–121)
BUN/Creatinine Ratio: 13 (ref 9–23)
BUN: 10 mg/dL (ref 6–24)
Bilirubin Total: 0.3 mg/dL (ref 0.0–1.2)
CO2: 21 mmol/L (ref 20–29)
Calcium: 9.1 mg/dL (ref 8.7–10.2)
Chloride: 105 mmol/L (ref 96–106)
Creatinine, Ser: 0.76 mg/dL (ref 0.57–1.00)
Globulin, Total: 3.4 g/dL (ref 1.5–4.5)
Glucose: 78 mg/dL (ref 70–99)
Potassium: 4.3 mmol/L (ref 3.5–5.2)
Sodium: 139 mmol/L (ref 134–144)
Total Protein: 7.1 g/dL (ref 6.0–8.5)
eGFR: 97 mL/min/{1.73_m2} (ref >59)

## 2022-09-18 LAB — HEMOGLOBIN A1C
Est. average glucose Bld gHb Est-mCnc: 134 mg/dL
Hgb A1c MFr Bld: 6.3 % — ABNORMAL HIGH (ref 4.8–5.6)

## 2022-12-23 ENCOUNTER — Ambulatory Visit: Payer: BC Managed Care – PPO | Attending: Nurse Practitioner | Admitting: Nurse Practitioner

## 2022-12-23 ENCOUNTER — Encounter: Payer: Self-pay | Admitting: Nurse Practitioner

## 2022-12-23 VITALS — BP 130/86 | HR 70 | Ht 66.0 in | Wt 318.8 lb

## 2022-12-23 DIAGNOSIS — D649 Anemia, unspecified: Secondary | ICD-10-CM | POA: Diagnosis not present

## 2022-12-23 DIAGNOSIS — Z1211 Encounter for screening for malignant neoplasm of colon: Secondary | ICD-10-CM | POA: Diagnosis not present

## 2022-12-23 DIAGNOSIS — I10 Essential (primary) hypertension: Secondary | ICD-10-CM

## 2022-12-23 DIAGNOSIS — R7303 Prediabetes: Secondary | ICD-10-CM

## 2022-12-23 NOTE — Progress Notes (Signed)
Assessment & Plan:  Courtney Nelson was seen today for hypertension.  Diagnoses and all orders for this visit:  Primary hypertension Continue all antihypertensives as prescribed.  Reminded to bring in blood pressure log for follow  up appointment.  RECOMMENDATIONS: DASH/Mediterranean Diets are healthier choices for HTN.    Colon cancer screening -     Ambulatory referral to Gastroenterology  Prediabetes -     Hemoglobin A1c Continue blood sugar control as discussed in office today, low carbohydrate diet, and regular physical exercise as tolerated, 150 minutes per week (30 min each day, 5 days per week, or 50 min 3 days per week).    Low hemoglobin -     CBC with Differential    Patient has been counseled on age-appropriate routine health concerns for screening and prevention. These are reviewed and up-to-date. Referrals have been placed accordingly. Immunizations are up-to-date or declined.    Subjective:   Chief Complaint  Patient presents with   Hypertension   [name] 47 y.o. female presents to office today for follow up to HTN  Patient has been counseled on age-appropriate routine health concerns for screening and prevention. These are reviewed and up-to-date. Referrals have been placed accordingly. Immunizations are up-to-date or declined.  MAMMOGRAM: OVERDUE: Referral placed PAP SMEAR: UTD COLONOSCOPY: OVERDUE. Referral placed      Blood pressure is well controlled with carvedilol 6.25 mg BID and amlodipine 10 mg daily.  BP Readings from Last 3 Encounters:  12/23/22 130/86  09/17/22 129/85  07/29/22 114/80     Review of Systems  Constitutional:  Negative for fever, malaise/fatigue and weight loss.  HENT: Negative.  Negative for nosebleeds.   Eyes: Negative.  Negative for blurred vision, double vision and photophobia.  Respiratory: Negative.  Negative for cough and shortness of breath.   Cardiovascular: Negative.  Negative for chest pain,  palpitations and leg swelling.  Gastrointestinal: Negative.  Negative for heartburn, nausea and vomiting.  Musculoskeletal: Negative.  Negative for myalgias.  Neurological: Negative.  Negative for dizziness, focal weakness, seizures and headaches.  Psychiatric/Behavioral: Negative.  Negative for suicidal ideas.     Past Medical History:  Diagnosis Date   Anemia    Anxiety    Hypertension     Past Surgical History:  Procedure Laterality Date   mirena insertion     inserted 12-21-17    Family History  Problem Relation Age of Onset   Hypertension Mother    Diabetes Mother    Thyroid cancer Mother    Stroke Maternal Grandmother    Diabetes Maternal Grandmother    Hypertension Maternal Grandfather    Diabetes Maternal Grandfather     Social History Reviewed with no changes to be made today.   Outpatient Medications Prior to Visit  Medication Sig Dispense Refill   amLODipine (NORVASC) 10 MG tablet Take 1 tablet (10 mg total) by mouth daily. 90 tablet 1   carvedilol (COREG) 6.25 MG tablet Take 1 tablet (6.25 mg total) by mouth 2 (two) times daily with a meal. 180 tablet 1   levonorgestrel (MIRENA) 20 MCG/DAY IUD 1 each by Intrauterine route once.     acetaminophen (TYLENOL) 500 MG tablet Take 2 tablets (1,000 mg total) by mouth every 6 (six) hours as needed. (Patient not taking: Reported on 12/23/2022) 30 tablet 0   No facility-administered medications prior to visit.    No Known Allergies     Objective:    BP 130/86 (BP Location: Left Arm, Patient Position:  Sitting, Cuff Size: Large)   Pulse 70   Ht 5\' 6"  (1.676 m)   Wt (!) 318 lb 12.8 oz (144.6 kg)   SpO2 98%   BMI 51.46 kg/m  Wt Readings from Last 3 Encounters:  12/23/22 (!) 318 lb 12.8 oz (144.6 kg)  09/17/22 (!) 316 lb 6.4 oz (143.5 kg)  07/29/22 (!) 312 lb (141.5 kg)    Physical Exam Vitals and nursing note reviewed.  Constitutional:      Appearance: She is well-developed.  HENT:     Head: Normocephalic  and atraumatic.  Cardiovascular:     Rate and Rhythm: Normal rate and regular rhythm.     Heart sounds: Normal heart sounds. No murmur heard.    No friction rub. No gallop.  Pulmonary:     Effort: Pulmonary effort is normal. No tachypnea or respiratory distress.     Breath sounds: Normal breath sounds. No decreased breath sounds, wheezing, rhonchi or rales.  Chest:     Chest wall: No tenderness.  Abdominal:     General: Bowel sounds are normal.     Palpations: Abdomen is soft.  Musculoskeletal:        General: Normal range of motion.     Cervical back: Normal range of motion.  Skin:    General: Skin is warm and dry.  Neurological:     Mental Status: She is alert and oriented to person, place, and time.     Coordination: Coordination normal.  Psychiatric:        Behavior: Behavior normal. Behavior is cooperative.        Thought Content: Thought content normal.        Judgment: Judgment normal.          Patient has been counseled extensively about nutrition and exercise as well as the importance of adherence with medications and regular follow-up. The patient was given clear instructions to go to ER or return to medical center if symptoms don't improve, worsen or new problems develop. The patient verbalized understanding.   Follow-up: Return in about 3 months (around 03/25/2023) for physical.   Claiborne Rigg, FNP-BC [facility] and [facility] [place], [place] [phone]   12/23/2022, 12:00 PM

## 2022-12-23 NOTE — Progress Notes (Signed)
5'6.75 

## 2022-12-24 LAB — CBC WITH DIFFERENTIAL/PLATELET
Basophils Absolute: 0 10*3/uL (ref 0.0–0.2)
Basos: 1 %
EOS (ABSOLUTE): 0.1 10*3/uL (ref 0.0–0.4)
Eos: 2 %
Hematocrit: 36.8 % (ref 34.0–46.6)
Hemoglobin: 12.4 g/dL (ref 11.1–15.9)
Immature Grans (Abs): 0 10*3/uL (ref 0.0–0.1)
Immature Granulocytes: 0 %
Lymphocytes Absolute: 2.7 10*3/uL (ref 0.7–3.1)
Lymphs: 47 %
MCH: 26.6 pg (ref 26.6–33.0)
MCHC: 33.7 g/dL (ref 31.5–35.7)
MCV: 79 fL (ref 79–97)
Monocytes Absolute: 0.4 10*3/uL (ref 0.1–0.9)
Monocytes: 8 %
Neutrophils Absolute: 2.4 10*3/uL (ref 1.4–7.0)
Neutrophils: 42 %
Platelets: 338 10*3/uL (ref 150–450)
RBC: 4.67 x10E6/uL (ref 3.77–5.28)
RDW: 14 % (ref 11.7–15.4)
WBC: 5.6 10*3/uL (ref 3.4–10.8)

## 2022-12-24 LAB — HEMOGLOBIN A1C
Est. average glucose Bld gHb Est-mCnc: 128 mg/dL
Hgb A1c MFr Bld: 6.1 % — ABNORMAL HIGH (ref 4.8–5.6)

## 2023-01-28 ENCOUNTER — Telehealth: Payer: Self-pay | Admitting: *Deleted

## 2023-01-28 NOTE — Telephone Encounter (Signed)
Hi Dr Tomasa Rand-- This patient is > 50 BMI.  How do you wish to proceed? If direct to hospital, would you mind forwarding this to your CMA? Thank you!

## 2023-01-28 NOTE — Telephone Encounter (Signed)
Attempted to contact patient to offer Cologuard.  Left message explaining BMI limit, extensive wait to have elective procedure at hospital,  and option to do a Cologuard. Requested patient call back and let us know what her preference is.

## 2023-01-29 ENCOUNTER — Telehealth: Payer: Self-pay | Admitting: *Deleted

## 2023-01-29 NOTE — Telephone Encounter (Signed)
Attempted to call patient again regarding BMI.  Upon further investigation noted last entered weight was shorter than other heights, which pushed the BMI over 50.  Advised in patient to keep PV 02/06/23 and to contact us if any further questions.

## 2023-02-03 ENCOUNTER — Telehealth: Payer: Self-pay | Admitting: *Deleted

## 2023-02-03 NOTE — Telephone Encounter (Signed)
Yesi,  This pt's BMI is greater than 50; their procedure will need to be performed at the hospital.  Thanks,  John Nulty 

## 2023-02-04 IMAGING — MG MM DIGITAL DIAGNOSTIC UNILAT*R* W/ TOMO W/ CAD
8 of 14 series · 8 of 40 positions shown · non-contrast
Comparison: None.

CLINICAL DATA: The patient was called back for right breast masses
and possible right axillary adenopathy

EXAM:
DIGITAL DIAGNOSTIC UNILATERAL RIGHT MAMMOGRAM WITH TOMOSYNTHESIS AND
CAD; ULTRASOUND RIGHT BREAST LIMITED
TECHNIQUE: Right digital diagnostic mammography and breast tomosynthesis was
performed. The images were evaluated with computer-aided detection.;
Targeted ultrasound examination of the right breast was performed

[R MLO synth-2D (1 of 3)]
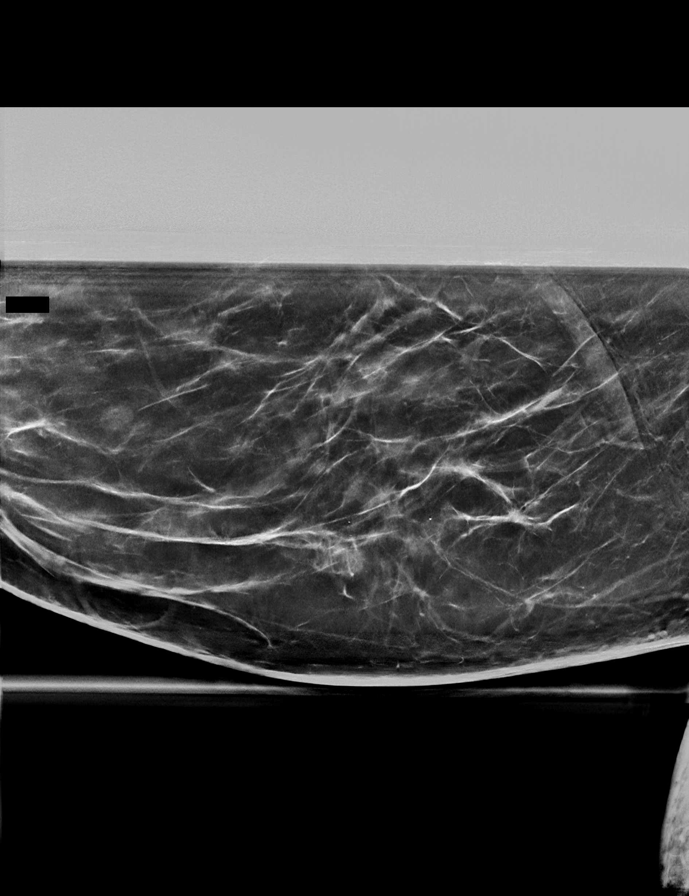

[R CC synth-2D (1 of 3)]
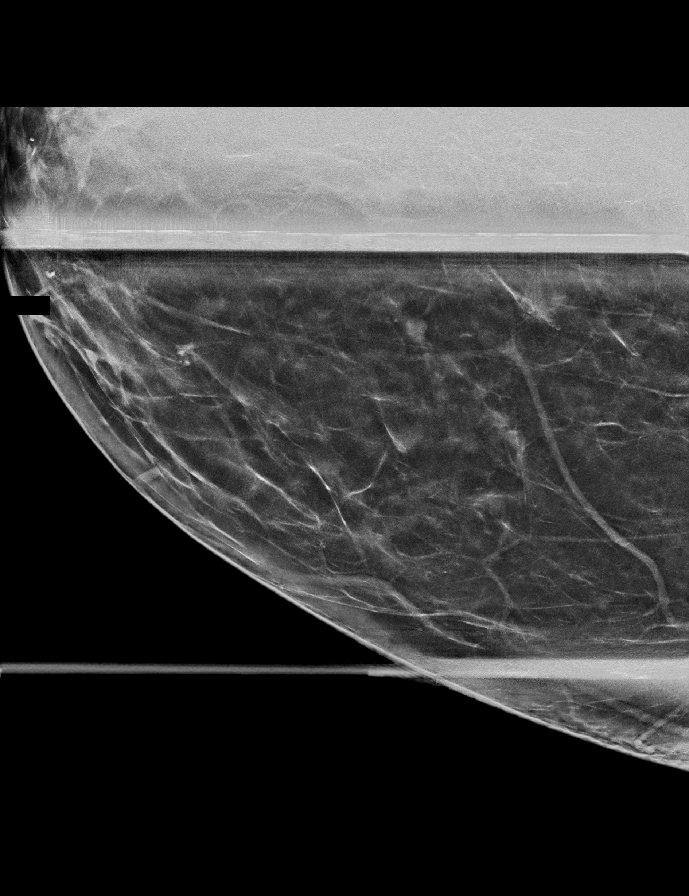

[R CC synth-2D (2 of 3)]
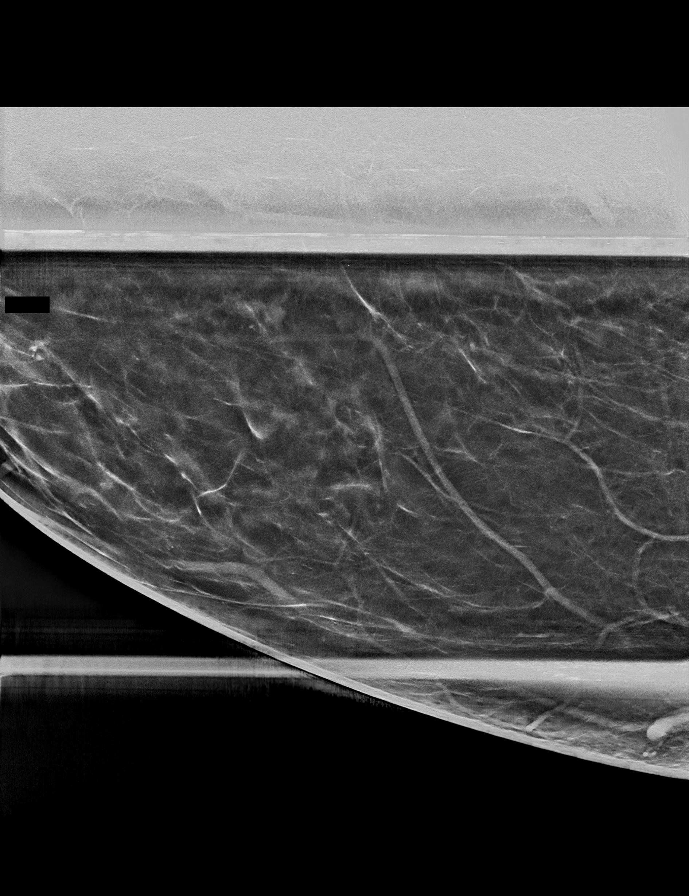

[R CC synth-2D (3 of 3)]
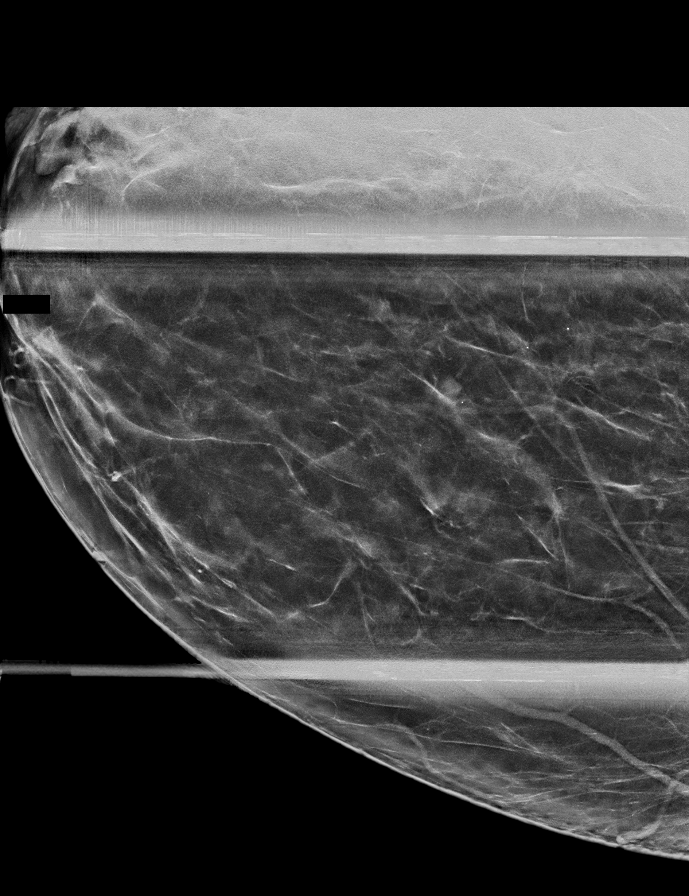

[R MLO synth-2D (2 of 3)]
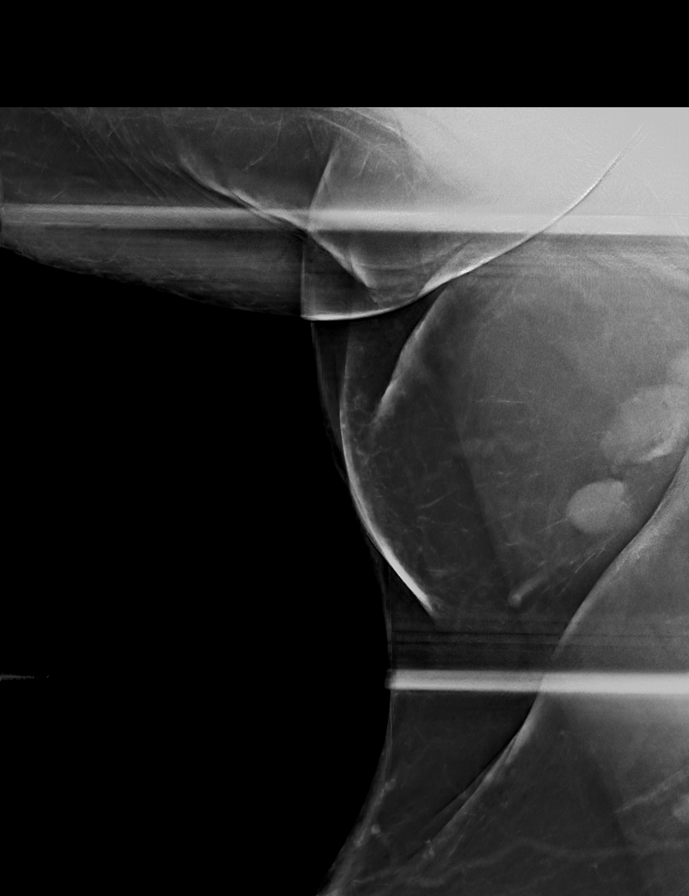

[R MLO synth-2D (3 of 3)]
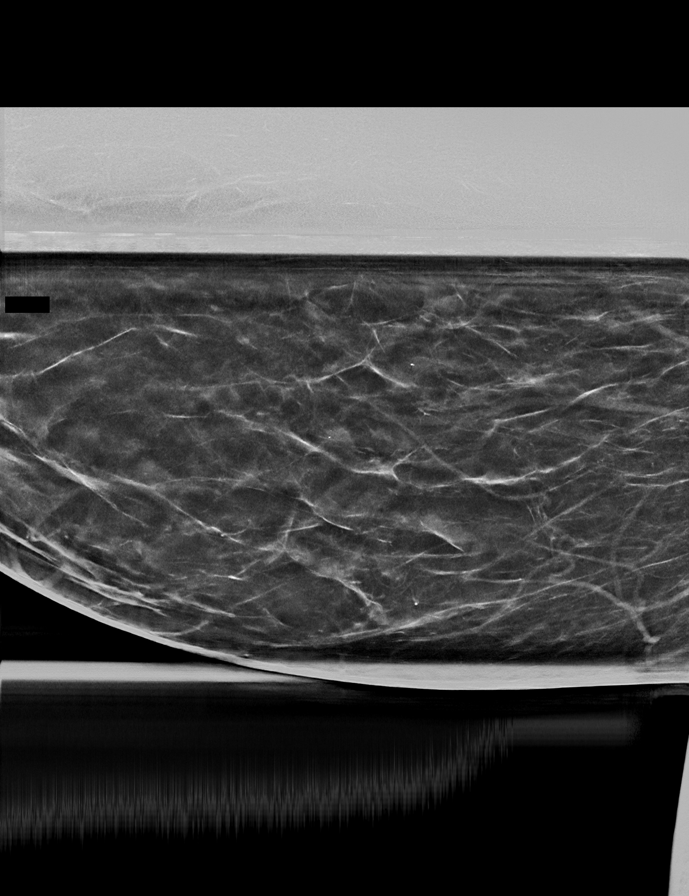

[R ML synth-2D]
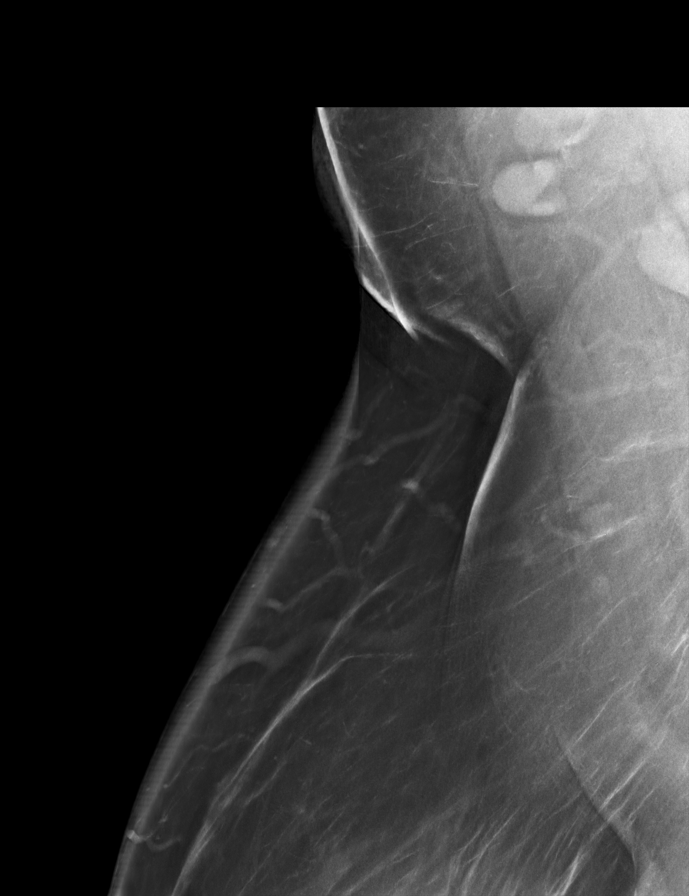

[R CC tomo · tomo slice 35/69.0]
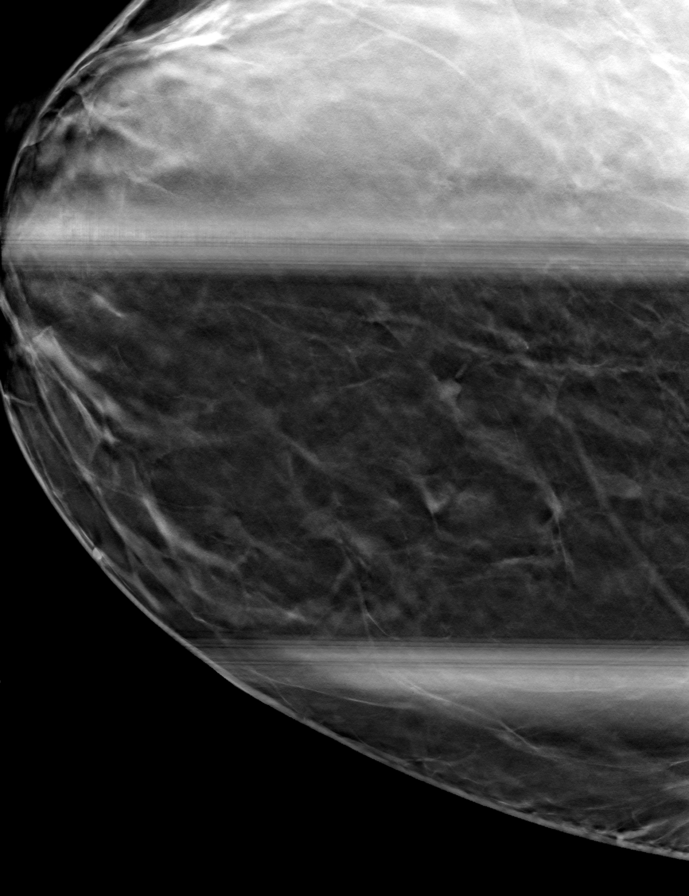

[8 of 40 positions shown; findings below may reference images not displayed]

ACR Breast Density Category b: There are scattered areas of
fibroglandular density.
FINDINGS: The possible masses in medial right breast persist on additional
imaging. Prominent lymph nodes remain in right axilla.

On physical exam, no suspicious lumps are identified.

Targeted ultrasound is performed, showing fibrocystic changes in the
medial right breast accounting for mammographic findings. Multiple
abnormal lymph nodes with thickened cortices are identified in the
bilateral axilla, scanned by myself. There are at least 3 abnormal
lymph nodes in both axilla.
IMPRESSION: Abnormal adenopathy in the bilateral axilla.  Fibrocystic changes.

RECOMMENDATION:
Recommend ultrasound-guided biopsy of 1 of the abnormal right
axillary nodes with a cortex measuring nearly 5 mm.

I have discussed the findings and recommendations with the patient.
If applicable, a reminder letter will be sent to the patient
regarding the next appointment.

BI-RADS CATEGORY  4: Suspicious.

## 2023-02-06 ENCOUNTER — Ambulatory Visit (AMBULATORY_SURGERY_CENTER): Payer: BC Managed Care – PPO

## 2023-02-06 ENCOUNTER — Encounter: Payer: Self-pay | Admitting: Gastroenterology

## 2023-02-06 VITALS — Ht 68.0 in | Wt 318.0 lb

## 2023-02-06 DIAGNOSIS — Z1211 Encounter for screening for malignant neoplasm of colon: Secondary | ICD-10-CM

## 2023-02-06 MED ORDER — NA SULFATE-K SULFATE-MG SULF 17.5-3.13-1.6 GM/177ML PO SOLN: 1.0000 | Freq: Once | ORAL | 0 refills | Status: AC

## 2023-02-06 NOTE — Progress Notes (Signed)
Pre visit completed in person; Patient verified name, DOB, and address;  No egg or soy allergy known to patient; No issues known to pt with past sedation with any surgeries or procedures; Patient denies ever being told they had issues or difficulty with intubation;  No FH of Malignant Hyperthermia; Pt is not on diet pills; Pt is not on home 02;  Pt is not on blood thinners;  Pt denies issues with constipation;  No A fib or A flutter; Have any cardiac testing pending--NO Insurance verified during PV appt--- BCBS State Pt can ambulate without assistance;  Pt denies use of chewing tobacco Discussed diabetic/weight loss medication holds; Discussed NSAID holds; Checked BMI to be less than 50; Pt instructed to use Single[employer] or GoodRx for a price reduction on prep  Patient's chart reviewed by Cathlyn Parsons CNRA prior to previsit and patient appropriate for the [facility].  Pre visit completed and red dot placed by patient's name on their procedure day (on provider's schedule).   Instructions printed and given to patient during PV appt;

## 2023-02-20 ENCOUNTER — Ambulatory Visit (AMBULATORY_SURGERY_CENTER): Payer: BC Managed Care – PPO | Admitting: Gastroenterology

## 2023-02-20 ENCOUNTER — Encounter: Payer: Self-pay | Admitting: Gastroenterology

## 2023-02-20 VITALS — BP 141/92 | HR 67 | Temp 97.1°F | Resp 10 | Ht 68.0 in | Wt 318.0 lb

## 2023-02-20 DIAGNOSIS — Z1211 Encounter for screening for malignant neoplasm of colon: Secondary | ICD-10-CM

## 2023-02-20 MED ORDER — SODIUM CHLORIDE 0.9 % IV SOLN: 500.0000 mL | Freq: Once | INTRAVENOUS | Status: DC

## 2023-02-20 NOTE — Progress Notes (Signed)
Report to PACU, RN, vss, BBS= Clear.  

## 2023-02-20 NOTE — Progress Notes (Signed)
Pt's states no medical or surgical changes since previsit or office visit. 

## 2023-02-20 NOTE — Op Note (Signed)
[facility] Patient Name: Courtney Nelson Procedure Date: 02/20/2023 9:39 AM MRN: [id number] Endoscopist: Lorin Picket E. Tomasa Rand , MD, [id number] Age: 47 Referring MD:  Date of Birth: 1976/03/01 Gender: Female Account #: [id number] Procedure:                Colonoscopy Indications:              Screening for colorectal malignant neoplasm, This                            is the patient's first colonoscopy Medicines:                Monitored Anesthesia Care Procedure:                Pre-Anesthesia Assessment:                           - Prior to the procedure, a History and Physical                            was performed, and patient medications and                            allergies were reviewed. The patient's tolerance of                            previous anesthesia was also reviewed. The risks                            and benefits of the procedure and the sedation                            options and risks were discussed with the patient.                            All questions were answered, and informed consent                            was obtained. Prior Anticoagulants: The patient has                            taken no anticoagulant or antiplatelet agents. ASA                            Grade Assessment: III - A patient with severe                            systemic disease. After reviewing the risks and                            benefits, the patient was deemed in satisfactory                            condition to undergo the procedure.  After obtaining informed consent, the colonoscope                            was passed under direct vision. Throughout the                            procedure, the patient's blood pressure, pulse, and                            oxygen saturations were monitored continuously. The                            Olympus CF-HQ190L [id number]) Colonoscope was                            introduced  through the anus and advanced to the the                            terminal ileum, with identification of the                            appendiceal orifice and IC valve. The colonoscopy                            was performed without difficulty. The patient                            tolerated the procedure well. The quality of the                            bowel preparation was good. The terminal ileum,                            ileocecal valve, appendiceal orifice, and rectum                            were photographed. The bowel preparation used was                            SUPREP via split dose instruction. Scope In: 9:44:12 AM Scope Out: 9:57:38 AM Scope Withdrawal Time: 0 hours 9 minutes 47 seconds  Total Procedure Duration: 0 hours 13 minutes 26 seconds  Findings:                 Skin tags were found on perianal exam.                           The digital rectal exam was normal. Pertinent                            negatives include normal sphincter tone and no                            palpable rectal lesions.  A single small angioectasia without bleeding was                            found in the cecum.                           The colon (entire examined portion) appeared normal.                           The terminal ileum appeared normal.                           The retroflexed view of the distal rectum and anal                            verge was normal and showed no anal or rectal                            abnormalities. Complications:            No immediate complications. Estimated Blood Loss:     Estimated blood loss: none. Impression:               - Perianal skin tags found on perianal exam.                           - A single non-bleeding colonic angioectasia.                           - The entire examined colon is normal.                           - The examined portion of the ileum was normal.                           - The  distal rectum and anal verge are normal on                            retroflexion view.                           - No specimens collected. Recommendation:           - Patient has a contact number available for                            emergencies. The signs and symptoms of potential                            delayed complications were discussed with the                            patient. Return to normal activities tomorrow.                            Written discharge instructions were provided to the  patient.                           - Resume previous diet.                           - Continue present medications.                           - Repeat colonoscopy in 10 years for screening                            purposes. Majesty Oehlert E. Tomasa Rand, MD 02/20/2023 10:02:49 AM This report has been signed electronically.

## 2023-02-20 NOTE — Progress Notes (Signed)
[facility] History and Physical   Primary Care Physician:  Claiborne Rigg, NP   Reason for Procedure:   Colon cancer screening  Plan:    Screening colonoscopy     HPI: Courtney Nelson is a 47 y.o. female undergoing initial average risk screening colonoscopy.  She has no family history of colon cancer and no chronic GI symptoms.    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 Authorizing Provider  amLODipine (NORVASC) 10 MG tablet Take 1 tablet (10 mg total) by mouth daily. 09/17/22  Yes Claiborne Rigg, NP  carvedilol (COREG) 6.25 MG tablet Take 1 tablet (6.25 mg total) by mouth 2 (two) times daily with a meal. 09/17/22  Yes Claiborne Rigg, NP  acetaminophen (TYLENOL) 500 MG tablet Take 2 tablets (1,000 mg total) by mouth every 6 (six) hours as needed. 12/19/21   Carlisle Beers, FNP  levonorgestrel (MIRENA) 20 MCG/DAY IUD 1 each by Intrauterine route once.    [provider]    Current Outpatient Medications  Medication Sig Dispense Refill   amLODipine (NORVASC) 10 MG tablet Take 1 tablet (10 mg total) by mouth daily. 90 tablet 1   carvedilol (COREG) 6.25 MG tablet Take 1 tablet (6.25 mg total) by mouth 2 (two) times daily with a meal. 180 tablet 1   acetaminophen (TYLENOL) 500 MG tablet Take 2 tablets (1,000 mg total) by mouth every 6 (six) hours as needed. 30 tablet 0   levonorgestrel (MIRENA) 20 MCG/DAY IUD 1 each by Intrauterine route once.     Current Facility-Administered Medications  Medication Dose Route Frequency Provider Last Rate Last Admin   0.9 %  sodium chloride infusion  500 mL Intravenous Once Jenel Lucks, MD         Allergies as of 02/20/2023   (No Known Allergies)    Family History  Problem Relation Age of Onset   Hypertension Mother    Diabetes Mother    Thyroid cancer Mother    Stroke Maternal Grandmother    Diabetes Maternal Grandmother    Hypertension Maternal Grandfather    Diabetes Maternal Grandfather    Colon polyps Neg Hx    Colon cancer Neg Hx    Esophageal cancer Neg Hx    Rectal cancer Neg Hx    Stomach cancer Neg Hx     Social History   Socioeconomic History   Marital status: Married    Spouse name: Not on file   Number of children: Not on file   Years of education: Not on file   Highest education level: Not on file  Occupational History   Not on file  Tobacco Use   Smoking status: Never   Smokeless tobacco: Never  Vaping Use   Vaping status: Never Used  Substance and Sexual Activity   Alcohol use: Yes    Alcohol/week: 1.0 standard drink of alcohol    Types: 1 Standard drinks or equivalent per week    Comment: occ   Drug use: Yes    Frequency: 1.0 times per week    Types:  Marijuana   Sexual activity: Yes    Partners: Male    Birth control/protection: I.U.D.    Comment: mirena iud  Other Topics Concern   Not on file  Social History Narrative   Not on file   Social Determinants of Health   Financial Resource Strain: Not on file  Food Insecurity: Not on file  Transportation Needs: Not on file  Physical Activity: Not on file  Stress: Not on file  Social Connections: Not on file  Intimate Partner Violence: Not on file    Review of Systems:  All other review of systems negative except as mentioned in the HPI.  Physical Exam: Vital signs BP 132/83   Pulse 66   Temp (!) 97.1 F (36.2 C) (Temporal)   Ht 5\' 8"  (1.727 m)   Wt (!) 318 lb (144.2 kg)   SpO2 100%   BMI 48.35 kg/m   General:   Alert,  Well-developed, well-nourished, pleasant and cooperative in NAD Airway:  Mallampati 3 Lungs:  Clear throughout to auscultation.   Heart:   Regular rate and rhythm; no murmurs, clicks, rubs,  or gallops. Abdomen:  Soft, nontender and nondistended. Normal bowel sounds.   Neuro/Psych:  Normal mood and affect. A and O x 3   Hilding Quintanar E. Tomasa Rand, MD [facility]

## 2023-02-20 NOTE — Patient Instructions (Addendum)
Continue present medications. Repeat colonoscopy in 10 years for screening purposes.                            YOU HAD AN ENDOSCOPIC PROCEDURE TODAY AT THE  ENDOSCOPY CENTER:   Refer to the procedure report that was given to you for any specific questions about what was found during the examination.  If the procedure report does not answer your questions, please call your gastroenterologist to clarify.  If you requested that your care partner not be given the details of your procedure findings, then the procedure report has been included in a sealed envelope for you to review at your convenience later.  YOU SHOULD EXPECT: Some feelings of bloating in the abdomen. Passage of more gas than usual.  Walking can help get rid of the air that was put into your GI tract during the procedure and reduce the bloating. If you had a lower endoscopy (such as a colonoscopy or flexible sigmoidoscopy) you may notice spotting of blood in your stool or on the toilet paper. If you underwent a bowel prep for your procedure, you may not have a normal bowel movement for a few days.  Please Note:  You might notice some irritation and congestion in your nose or some drainage.  This is from the oxygen used during your procedure.  There is no need for concern and it should clear up in a day or so.  SYMPTOMS TO REPORT IMMEDIATELY:  Following lower endoscopy (colonoscopy or flexible sigmoidoscopy):  Excessive amounts of blood in the stool  Significant tenderness or worsening of abdominal pains  Swelling of the abdomen that is new, acute  Fever of 100F or higher  For urgent or emergent issues, a gastroenterologist can be reached at any hour by calling [phone]. Do not use MyChart messaging for urgent concerns.    DIET:  We do recommend a small meal at first, but then you may proceed to your regular diet.  Drink plenty of fluids but you should avoid alcoholic beverages for 24 hours.  ACTIVITY:  You should  plan to take it easy for the rest of today and you should NOT DRIVE or use heavy machinery until tomorrow (because of the sedation medicines used during the test).    FOLLOW UP: Our staff will call the number listed on your records the next business day following your procedure.  We will call around 7:15- 8:00 am to check on you and address any questions or concerns that you may have regarding the information given to you following your procedure. If we do not reach you, we will leave a message.       SIGNATURES/CONFIDENTIALITY: You and/or your care partner have signed paperwork which will be entered into your electronic medical record.  These signatures attest to the fact that that the information above on your After Visit Summary has been reviewed and is understood.  Full responsibility of the confidentiality of this discharge information lies with you and/or your care-partner.  

## 2023-02-24 ENCOUNTER — Telehealth: Payer: Self-pay | Admitting: *Deleted

## 2023-02-24 NOTE — Telephone Encounter (Signed)
  Follow up Call-     02/20/2023    8:50 AM  Call back number  Post procedure Call Back phone  # [phone]  Permission to leave phone message Yes     Patient questions:  Do you have a fever, pain , or abdominal swelling? No. Pain Score  0 *  Have you tolerated food without any problems? Yes.    Have you been able to return to your normal activities? Yes.    Do you have any questions about your discharge instructions: Diet   No. Medications  No. Follow up visit  No.  Do you have questions or concerns about your Care? No.  Actions: * If pain score is 4 or above: No action needed, pain <4.

## 2023-04-03 ENCOUNTER — Ambulatory Visit: Payer: BC Managed Care – PPO | Attending: Nurse Practitioner | Admitting: Nurse Practitioner

## 2023-04-03 ENCOUNTER — Encounter: Payer: Self-pay | Admitting: Nurse Practitioner

## 2023-04-03 VITALS — BP 134/90 | HR 66 | Ht 68.0 in | Wt 317.8 lb

## 2023-04-03 DIAGNOSIS — Z23 Encounter for immunization: Secondary | ICD-10-CM | POA: Diagnosis not present

## 2023-04-03 DIAGNOSIS — Z0000 Encounter for general adult medical examination without abnormal findings: Secondary | ICD-10-CM

## 2023-04-03 DIAGNOSIS — I10 Essential (primary) hypertension: Secondary | ICD-10-CM

## 2023-04-03 MED ORDER — CARVEDILOL 6.25 MG PO TABS: 6.2500 mg | ORAL_TABLET | Freq: Two times a day (BID) | ORAL | 1 refills | Status: DC

## 2023-04-03 MED ORDER — AMLODIPINE BESYLATE 10 MG PO TABS: 10.0000 mg | ORAL_TABLET | Freq: Every day | ORAL | 1 refills | Status: DC

## 2023-04-03 NOTE — Progress Notes (Signed)
Assessment & Plan:  Courtney "Tiffany" was seen today for annual exam.  Diagnoses and all orders for this visit:  Encounter for annual physical exam  Encounter for immunization -     Flu vaccine trivalent PF, 6mos and older(Flulaval,Afluria,Fluarix,Fluzone)  Primary hypertension -     amLODipine (NORVASC) 10 MG tablet; Take 1 tablet (10 mg total) by mouth daily. -     carvedilol (COREG) 6.25 MG tablet; Take 1 tablet (6.25 mg total) by mouth 2 (two) times daily with a meal.    Patient has been counseled on age-appropriate routine health concerns for screening and prevention. These are reviewed and up-to-date. Referrals have been placed accordingly. Immunizations are up-to-date or declined.    Subjective:   Chief Complaint  Patient presents with   Annual Exam   [name] 47 y.o. female presents to office today for annual physical.   Blood pressure is elevated.  Her amlodipine and carvedilol appear to be expired prescriptions which may indicate some medication nonadherence  BP Readings from Last 3 Encounters:  04/03/23 (!) 134/90  02/20/23 (!) 141/92  12/23/22 130/86    Patient has been counseled on age-appropriate routine health concerns for screening and prevention. These are reviewed and up-to-date. Referrals have been placed accordingly. Immunizations are up-to-date or declined.    Mammogram: Up-to-date Pap smear: Up-to-date Colon cancer screening: Up-to-date   Review of Systems  Constitutional:  Negative for fever, malaise/fatigue and weight loss.  HENT: Negative.  Negative for nosebleeds.   Eyes: Negative.  Negative for blurred vision, double vision and photophobia.  Respiratory: Negative.  Negative for cough and shortness of breath.   Cardiovascular: Negative.  Negative for chest pain, palpitations and leg swelling.  Gastrointestinal: Negative.  Negative for heartburn, nausea and vomiting.  Genitourinary: Negative.   Musculoskeletal: Negative.   Negative for myalgias.  Skin: Negative.   Neurological: Negative.  Negative for dizziness, focal weakness, seizures and headaches.  Endo/Heme/Allergies: Negative.   Psychiatric/Behavioral: Negative.  Negative for suicidal ideas.     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   Outpatient Medications Prior to Visit  Medication Sig Dispense Refill   acetaminophen (TYLENOL) 500 MG tablet Take 2 tablets (1,000 mg total) by mouth every 6 (six) hours as needed. 30 tablet 0   levonorgestrel (MIRENA) 20 MCG/DAY IUD 1 each by Intrauterine route once.     amLODipine (NORVASC) 10 MG tablet Take 1 tablet (10 mg total) by mouth daily. 90 tablet 1   carvedilol (COREG) 6.25 MG tablet Take 1 tablet (6.25 mg total) by mouth 2 (two) times daily with  a meal. 180 tablet 1   No facility-administered medications prior to visit.    No Known Allergies     Objective:    BP (!) 134/90 (BP Location: Left Arm, Patient Position: Sitting, Cuff Size: Normal)   Pulse 66   Ht 5\' 8"  (1.727 m)   Wt (!) 317 lb 12.8 oz (144.2 kg)   LMP  (LMP Unknown)   SpO2 99%   BMI 48.32 kg/m  Wt Readings from Last 3 Encounters:  04/03/23 (!) 317 lb 12.8 oz (144.2 kg)  02/20/23 (!) 318 lb (144.2 kg)  02/06/23 (!) 318 lb (144.2 kg)    Physical  Exam Constitutional:      Appearance: She is well-developed.  HENT:     Head: Normocephalic and atraumatic.     Right Ear: Hearing, tympanic membrane, ear canal and external ear normal.     Left Ear: Hearing, tympanic membrane, ear canal and external ear normal.     Nose: Nose normal.     Right Turbinates: Not enlarged.     Left Turbinates: Not enlarged.     Mouth/Throat:     Lips: Pink.     Mouth: Mucous membranes are moist.     Dentition: No dental tenderness, gingival swelling, dental abscesses or gum lesions.     Pharynx: No oropharyngeal exudate.  Eyes:     General: No scleral icterus.       Right eye: No discharge.     Extraocular Movements: Extraocular movements intact.     Conjunctiva/sclera: Conjunctivae normal.     Pupils: Pupils are equal, round, and reactive to light.  Neck:     Thyroid: No thyromegaly.     Trachea: No tracheal deviation.  Cardiovascular:     Rate and Rhythm: Normal rate and regular rhythm.     Heart sounds: Normal heart sounds. No murmur heard.    No friction rub.  Pulmonary:     Effort: Pulmonary effort is normal. No accessory muscle usage or respiratory distress.     Breath sounds: Normal breath sounds. No decreased breath sounds, wheezing, rhonchi or rales.  Abdominal:     General: Bowel sounds are normal. There is no distension.     Palpations: Abdomen is soft. There is no mass.     Tenderness: There is no abdominal tenderness. There is no right CVA tenderness, left CVA tenderness, guarding or rebound.     Hernia: No hernia is present.  Musculoskeletal:        General: No tenderness or deformity. Normal range of motion.     Cervical back: Normal range of motion and neck supple.  Lymphadenopathy:     Cervical: No cervical adenopathy.  Skin:    General: Skin is warm and dry.     Findings: No erythema.  Neurological:     Mental Status: She is alert and oriented to person, place, and time.     Cranial Nerves: No cranial nerve deficit.      Motor: Motor function is intact.     Coordination: Coordination is intact. Coordination normal.     Gait: Gait is intact.     Deep Tendon Reflexes:     Reflex Scores:      Patellar reflexes are 1+ on the right side and 1+ on the left side. Psychiatric:        Attention and Perception: Attention normal.        Mood and Affect: Mood normal.        Speech: Speech  normal.        Behavior: Behavior normal.        Thought Content: Thought content normal.        Judgment: Judgment normal.          Patient has been counseled extensively about nutrition and exercise as well as the importance of adherence with medications and regular follow-up. The patient was given clear instructions to go to ER or return to medical center if symptoms don't improve, worsen or new problems develop. The patient verbalized understanding.   Follow-up: Return in about 6 months (around 10/02/2023).   Claiborne Rigg, FNP-BC [facility] [place], [place] [phone]   04/03/2023, 10:33 AM

## 2023-04-03 NOTE — Patient Instructions (Addendum)
Try to lose 10 lbs by next visit to help lower blood pressure. Avoid processed foods and excessive sodium intake  If blood pressure persistently greater than 130/80 at home. Please increase carvedilol to 2 tablets twice per day.

## 2023-04-08 ENCOUNTER — Ambulatory Visit
Admission: RE | Admit: 2023-04-08 | Discharge: 2023-04-08 | Disposition: A | Discharge status: Home / Self Care | Payer: BC Managed Care – PPO | Source: Ambulatory Visit | Attending: Nurse Practitioner

## 2023-04-08 DIAGNOSIS — Z1231 Encounter for screening mammogram for malignant neoplasm of breast: Secondary | ICD-10-CM

## 2023-06-03 ENCOUNTER — Ambulatory Visit: Payer: Self-pay | Admitting: *Deleted

## 2023-06-03 NOTE — Telephone Encounter (Signed)
  Chief Complaint: Depression Symptoms: States sees her therapist weekly, he recommended "A mood stabilizer  from my PCP."  "Not excited about things, tired."  My therapist said "I'm not finding joy in my life." Frequency: 1 year Pertinent Negatives: Patient denies  SI, HI Disposition: [] ED /[] Urgent Care (no appt availability in office) / [] Appointment(In office/virtual)/ []  [place] [facility]/ [] Home Care/ [] Refused Recommended Disposition /[] [place] [facility]/ [x]  Follow-up with PCP Additional Notes:  Does pt need appt as therapist recommended meds? Sees therapist weekly. Please advise. Care advise provided, pt verbalizes understanding.  Reason for Disposition  [1] New or changed psychiatric medications > 2 weeks ago AND [2] not feeling any better    Therapist recommenced "Mood stabilizer."  Answer Assessment - Initial Assessment Questions 1. CONCERN: "What happened that made you call today?"     Therapist 2. DEPRESSION SYMPTOM SCREENING: "How are you feeling overall?" (e.g., decreased energy, increased sleeping or difficulty sleeping, difficulty concentrating, feelings of sadness, guilt, hopelessness, or worthlessness)     1 year 3. RISK OF HARM - SUICIDAL IDEATION:  "Do you ever have thoughts of hurting or killing yourself?"  (e.g., yes, no, no but preoccupation with thoughts about death)   - INTENT:  "Do you have thoughts of hurting or killing yourself right NOW?" (e.g., yes, no, N/A)   - PLAN: "Do you have a specific plan for how you would do this?" (e.g., gun, knife, overdose, no plan, N/A)     No 4. RISK OF HARM - HOMICIDAL IDEATION:  "Do you ever have thoughts of hurting or killing someone else?"  (e.g., yes, no, no but preoccupation with thoughts about death)   - INTENT:  "Do you have thoughts of hurting or killing someone right NOW?" (e.g., yes, no, N/A)   - PLAN: "Do you have a specific plan for how you would do this?" (e.g., gun, knife, no plan, N/A)      No 5.  FUNCTIONAL IMPAIRMENT: "How have things been going for you overall? Have you had more difficulty than usual doing your normal daily activities?"  (e.g., better, same, worse; self-care, school, work, interactions)     Yes able to do all things 6. SUPPORT: "Who is with you now?" "Who do you live with?" "Do you have family or friends who you can talk to?"      Therapist 7. THERAPIST: "Do you have a counselor or therapist? Name?"     yes 8. STRESSORS: "Has there been any new stress or recent changes in your life?"     No 9. ALCOHOL USE OR SUBSTANCE USE (DRUG USE): "Do you drink alcohol or use any illegal drugs?"     no 10. OTHER: "Do you have any other physical symptoms right now?" (e.g., fever)       Not excited about anything, tired.  Protocols used: Depression-A-AH

## 2023-06-04 ENCOUNTER — Other Ambulatory Visit: Payer: Self-pay | Admitting: Nurse Practitioner

## 2023-06-04 MED ORDER — SERTRALINE HCL 25 MG PO TABS
12.5000 mg | ORAL_TABLET | Freq: Every day | ORAL | 3 refills | Status: DC
Start: 2023-06-04 — End: 2023-10-21

## 2023-06-04 NOTE — Telephone Encounter (Signed)
Patient  aware that Zoloft sent   Take 1/2 tablet for 2-3 weeks then increase to a whole tablet if needed.

## 2023-06-04 NOTE — Telephone Encounter (Signed)
Zoloft sent  Take 1/2 tablet for 2-3 weeks then increase to a whole tablet if needed

## 2023-10-02 ENCOUNTER — Ambulatory Visit: Payer: BC Managed Care – PPO | Admitting: Nurse Practitioner

## 2023-10-21 ENCOUNTER — Ambulatory Visit: Attending: Nurse Practitioner | Admitting: Nurse Practitioner

## 2023-10-21 ENCOUNTER — Encounter: Payer: Self-pay | Admitting: Nurse Practitioner

## 2023-10-21 VITALS — BP 115/79 | HR 67 | Resp 19 | Ht 68.0 in | Wt 309.0 lb

## 2023-10-21 DIAGNOSIS — R7303 Prediabetes: Secondary | ICD-10-CM | POA: Diagnosis not present

## 2023-10-21 DIAGNOSIS — E7849 Other hyperlipidemia: Secondary | ICD-10-CM

## 2023-10-21 DIAGNOSIS — I10 Essential (primary) hypertension: Secondary | ICD-10-CM

## 2023-10-21 DIAGNOSIS — F32A Depression, unspecified: Secondary | ICD-10-CM

## 2023-10-21 DIAGNOSIS — F419 Anxiety disorder, unspecified: Secondary | ICD-10-CM | POA: Diagnosis not present

## 2023-10-21 MED ORDER — SERTRALINE HCL 25 MG PO TABS: 25.0000 mg | ORAL_TABLET | Freq: Every day | ORAL | 3 refills | Status: DC

## 2023-10-21 MED ORDER — CARVEDILOL 6.25 MG PO TABS: 6.2500 mg | ORAL_TABLET | Freq: Two times a day (BID) | ORAL | 1 refills | Status: DC

## 2023-10-21 MED ORDER — AMLODIPINE BESYLATE 10 MG PO TABS: 10.0000 mg | ORAL_TABLET | Freq: Every day | ORAL | 1 refills | Status: DC

## 2023-10-21 NOTE — Progress Notes (Signed)
 Assessment & Plan:  Courtney "Jyl Or" was seen today for medical management of chronic issues.  Diagnoses and all orders for this visit:  Primary hypertension -     CMP14+EGFR -     amLODipine  (NORVASC ) 10 MG tablet; Take 1 tablet (10 mg total) by mouth daily. -     carvedilol  (COREG ) 6.25 MG tablet; Take 1 tablet (6.25 mg total) by mouth 2 (two) times daily with a meal.  Other hyperlipidemia -     Lipid panel  Prediabetes -     Hemoglobin A1c  Anxiety and depression -     sertraline  (ZOLOFT ) 25 MG tablet; Take 1 tablet (25 mg total) by mouth daily.    Patient has been counseled on age-appropriate routine health concerns for screening and prevention. These are reviewed and up-to-date. Referrals have been placed accordingly. Immunizations are up-to-date or declined.    Subjective:   Chief Complaint  Patient presents with   Medical Management of Chronic Issues    Courtney Nelson 48 y.o. female presents to office today for follow up to HTN, anxiety and depression.   Blood pressure is well-controlled with amlodipine  10 mg daily and carvedilol  6.25 mg twice daily.  Weight is also trending down nicely. BP Readings from Last 3 Encounters:  10/21/23 115/79  04/03/23 (!) 134/90  02/20/23 (!) 141/92      Anxiety and depression Reports significant improvement in mood lability and is currently taking Zoloft  25 mg daily.    10/21/2023    9:46 AM 04/03/2023   10:17 AM 12/23/2022   10:30 AM  Depression screen PHQ 2/9  Decreased Interest 0 0 1  Down, Depressed, Hopeless 0 1 0  PHQ - 2 Score 0 1 1  Altered sleeping 0 1 1  Tired, decreased energy 1 1 0  Change in appetite 0 0 1  Feeling bad or failure about yourself  0 1 0  Trouble concentrating 0 1 0  Moving slowly or fidgety/restless 0 0 0  Suicidal thoughts 0 0 0  PHQ-9 Score 1 5 3   Difficult doing work/chores Not difficult at all         10/21/2023    9:46 AM 04/03/2023   10:17 AM 12/23/2022   10:30 AM  09/17/2022    9:38 AM  GAD 7 : Generalized Anxiety Score  Nervous, Anxious, on Edge 2 1 1 1   Control/stop worrying 1 1 1 1   Worry too much - different things 2 1 0 1  Trouble relaxing 1 1 1 1   Restless 0 0 0 0  Easily annoyed or irritable 0 1 1 1   Afraid - awful might happen 0 1 0 0  Total GAD 7 Score 6 6 4 5      Review of Systems  Constitutional:  Negative for fever, malaise/fatigue and weight loss.  HENT: Negative.  Negative for nosebleeds.   Eyes: Negative.  Negative for blurred vision, double vision and photophobia.  Respiratory: Negative.  Negative for cough and shortness of breath.   Cardiovascular: Negative.  Negative for chest pain, palpitations and leg swelling.  Gastrointestinal: Negative.  Negative for heartburn, nausea and vomiting.  Musculoskeletal: Negative.  Negative for myalgias.  Neurological: Negative.  Negative for dizziness, focal weakness, seizures and headaches.  Psychiatric/Behavioral: Negative.  Negative for suicidal ideas.     Past Medical History:  Diagnosis Date   Anemia    Anxiety    Hypertension    on meds   Seizures (HCC)  infantile seizures- last episode- age 26    Past Surgical History:  Procedure Laterality Date   mirena  insertion     inserted 12-21-17   WISDOM TOOTH EXTRACTION      Family History  Problem Relation Age of Onset   Hypertension Mother    Diabetes Mother    Thyroid  cancer Mother    Stroke Maternal Grandmother    Diabetes Maternal Grandmother    Hypertension Maternal Grandfather    Diabetes Maternal Grandfather    Colon polyps Neg Hx    Colon cancer Neg Hx    Esophageal cancer Neg Hx    Rectal cancer Neg Hx    Stomach cancer Neg Hx     Social History Reviewed with no changes to be made today.   Outpatient Medications Prior to Visit  Medication Sig Dispense Refill   acetaminophen  (TYLENOL ) 500 MG tablet Take 2 tablets (1,000 mg total) by mouth every 6 (six) hours as needed. 30 tablet 0   levonorgestrel  (MIRENA )  20 MCG/DAY IUD 1 each by Intrauterine route once.     amLODipine  (NORVASC ) 10 MG tablet Take 1 tablet (10 mg total) by mouth daily. 90 tablet 1   carvedilol  (COREG ) 6.25 MG tablet Take 1 tablet (6.25 mg total) by mouth 2 (two) times daily with a meal. 180 tablet 1   sertraline  (ZOLOFT ) 25 MG tablet Take 0.5-1 tablets (12.5-25 mg total) by mouth daily. Start with half tablet for 3 weeks if no improvement in symptoms increase to one whole tablet by mouth daily. 30 tablet 3   No facility-administered medications prior to visit.    No Known Allergies     Objective:    BP 115/79 (BP Location: Left Arm, Patient Position: Sitting, Cuff Size: Normal)   Pulse 67   Resp 19   Ht 5\' 8"  (1.727 m)   Wt (!) 309 lb (140.2 kg)   SpO2 100%   BMI 46.98 kg/m  Wt Readings from Last 3 Encounters:  10/21/23 (!) 309 lb (140.2 kg)  04/03/23 (!) 317 lb 12.8 oz (144.2 kg)  02/20/23 (!) 318 lb (144.2 kg)    Physical Exam Vitals and nursing note reviewed.  Constitutional:      Appearance: She is well-developed.  HENT:     Head: Normocephalic and atraumatic.  Cardiovascular:     Rate and Rhythm: Normal rate and regular rhythm.     Heart sounds: Normal heart sounds. No murmur heard.    No friction rub. No gallop.  Pulmonary:     Effort: Pulmonary effort is normal. No tachypnea or respiratory distress.     Breath sounds: Normal breath sounds. No decreased breath sounds, wheezing, rhonchi or rales.  Chest:     Chest wall: No tenderness.  Musculoskeletal:        General: Normal range of motion.     Cervical back: Normal range of motion.  Skin:    General: Skin is warm and dry.  Neurological:     Mental Status: She is alert and oriented to person, place, and time.     Coordination: Coordination normal.  Psychiatric:        Behavior: Behavior normal. Behavior is cooperative.        Thought Content: Thought content normal.        Judgment: Judgment normal.          Patient has been counseled  extensively about nutrition and exercise as well as the importance of adherence with medications and regular follow-up. The  patient was given clear instructions to go to ER or return to medical center if symptoms don't improve, worsen or new problems develop. The patient verbalized understanding.   Follow-up: Return in about 4 months (around 02/20/2024).   Collins Dean, FNP-BC [facility] [place], [place] [phone]   10/21/2023, 10:18 AM

## 2023-10-22 LAB — CMP14+EGFR
ALT: 10 IU/L (ref 0–32)
AST: 11 IU/L (ref 0–40)
Albumin: 3.8 g/dL — ABNORMAL LOW (ref 3.9–4.9)
Alkaline Phosphatase: 89 IU/L (ref 44–121)
BUN/Creatinine Ratio: 10 (ref 9–23)
BUN: 9 mg/dL (ref 6–24)
Bilirubin Total: 0.3 mg/dL (ref 0.0–1.2)
CO2: 21 mmol/L (ref 20–29)
Calcium: 9.1 mg/dL (ref 8.7–10.2)
Chloride: 104 mmol/L (ref 96–106)
Creatinine, Ser: 0.87 mg/dL (ref 0.57–1.00)
Globulin, Total: 3.3 g/dL (ref 1.5–4.5)
Glucose: 107 mg/dL — ABNORMAL HIGH (ref 70–99)
Potassium: 4.4 mmol/L (ref 3.5–5.2)
Sodium: 138 mmol/L (ref 134–144)
Total Protein: 7.1 g/dL (ref 6.0–8.5)
eGFR: 82 mL/min/{1.73_m2} (ref >59)

## 2023-10-22 LAB — LIPID PANEL
Chol/HDL Ratio: 3.8 ratio (ref 0.0–4.4)
Cholesterol, Total: 164 mg/dL (ref 100–199)
HDL: 43 mg/dL (ref >39)
LDL Chol Calc (NIH): 103 mg/dL — ABNORMAL HIGH (ref 0–99)
Triglycerides: 96 mg/dL (ref 0–149)
VLDL Cholesterol Cal: 18 mg/dL (ref 5–40)

## 2023-10-22 LAB — HEMOGLOBIN A1C
Est. average glucose Bld gHb Est-mCnc: 123 mg/dL
Hgb A1c MFr Bld: 5.9 % — ABNORMAL HIGH (ref 4.8–5.6)

## 2023-10-24 ENCOUNTER — Encounter: Payer: Self-pay | Admitting: Nurse Practitioner

## 2024-02-19 ENCOUNTER — Ambulatory Visit: Admitting: Nurse Practitioner

## 2024-02-23 ENCOUNTER — Telehealth: Payer: Self-pay | Admitting: Nurse Practitioner

## 2024-02-23 NOTE — Telephone Encounter (Signed)
Pt confirmed appt 9/2

## 2024-02-24 ENCOUNTER — Encounter: Payer: Self-pay | Admitting: Nurse Practitioner

## 2024-02-24 ENCOUNTER — Ambulatory Visit: Attending: Nurse Practitioner | Admitting: Nurse Practitioner

## 2024-02-24 VITALS — BP 132/86 | HR 70 | Resp 19 | Ht 68.0 in | Wt 313.6 lb

## 2024-02-24 DIAGNOSIS — Z23 Encounter for immunization: Secondary | ICD-10-CM

## 2024-02-24 DIAGNOSIS — Z7901 Long term (current) use of anticoagulants: Secondary | ICD-10-CM | POA: Diagnosis not present

## 2024-02-24 DIAGNOSIS — I10 Essential (primary) hypertension: Secondary | ICD-10-CM

## 2024-02-24 DIAGNOSIS — Z1231 Encounter for screening mammogram for malignant neoplasm of breast: Secondary | ICD-10-CM

## 2024-02-24 DIAGNOSIS — Z79899 Other long term (current) drug therapy: Secondary | ICD-10-CM | POA: Diagnosis not present

## 2024-02-24 NOTE — Patient Instructions (Signed)
 [facility] Located in: [facility] Address: [address], [place], [place] [address] Phone: [phone]

## 2024-02-24 NOTE — Progress Notes (Signed)
 Assessment & Plan:  Courtney Nelson was seen today for hypertension.  Diagnoses and all orders for this visit:  Primary hypertension Continue all antihypertensives as prescribed.  Reminded to bring in blood pressure log for follow  up appointment.  RECOMMENDATIONS: DASH/Mediterranean Diets are healthier choices for HTN.    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 Discussed long-term joint, back, and knee effects. She is not interested in weight loss medication now but open to future discussion. Acknowledged potential medication side effects.  Prediabetes Plan to monitor A1c to assess prediabetes status. - Check A1c at next visit.  Depression Sertraline  effective at current dose, no side effects affecting appetite.  General Health Maintenance Mammogram due next month. Recent colonoscopy last year. - Order mammogram and provide breast center contact information.  Patient has been counseled on age-appropriate routine health concerns for screening and prevention. These are reviewed and up-to-date. Referrals have been placed accordingly. Immunizations are up-to-date or declined.    Subjective:   Chief Complaint  Patient presents with   Hypertension   History of Present Illness Courtney Nelson is a 48 year old female who presents for a routine follow-up visit.   HTN Blood pressure is well controlled. She is taking amlodipine  10 mg daily and carvedilol  6.25 mg BID.  BP Readings from Last 3 Encounters:  02/24/24 132/86  10/21/23 115/79  04/03/23 (!) 134/90     She has prediabetes. Her weight has been fluctuating, described as 'yo-yoing back and forth', and she has a BMI of 47.68. She is not currently interested in weight loss  medication. Lab Results  Component Value Date   HGBA1C 5.9 (H) 10/21/2023     She is currently taking sertraline  (Zoloft ) and feels that it is effective at the current dose. She has not noticed any changes in appetite related to the medication.  Her mammogram is due in October. She had a colonoscopy last year.   Review of Systems  Constitutional:  Negative for fever, malaise/fatigue and weight loss.  HENT: Negative.  Negative for nosebleeds.   Eyes: Negative.  Negative for blurred vision, double vision and photophobia.  Respiratory: Negative.  Negative for cough and shortness of breath.   Cardiovascular: Negative.  Negative for chest pain, palpitations and leg swelling.  Gastrointestinal: Negative.  Negative for heartburn, nausea and vomiting.  Musculoskeletal: Negative.  Negative for myalgias.  Neurological: Negative.  Negative for dizziness, focal weakness, seizures and headaches.  Psychiatric/Behavioral: Negative.  Negative for suicidal ideas.     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   Outpatient Medications Prior to Visit  Medication Sig Dispense Refill   acetaminophen  (TYLENOL ) 500 MG tablet Take 2 tablets (1,000 mg total) by mouth every 6 (six) hours as needed. 30 tablet 0   amLODipine  (NORVASC ) 10 MG  tablet Take 1 tablet (10 mg total) by mouth daily. 90 tablet 1   carvedilol  (COREG ) 6.25 MG tablet Take 1 tablet (6.25 mg total) by mouth 2 (two) times daily with a meal. 180 tablet 1   levonorgestrel  (MIRENA ) 20 MCG/DAY IUD 1 each by Intrauterine route once.     sertraline  (ZOLOFT ) 25 MG tablet Take 1 tablet (25 mg total) by mouth daily. 90 tablet 3   No facility-administered medications prior to visit.    No Known Allergies     Objective:    BP 132/86 (BP Location: Left Arm, Patient Position: Sitting, Cuff Size: Normal)   Pulse 70   Resp 19   Ht 5' 8 (1.727 m)   Wt (!) 313 lb 9.6 oz (142.2 kg)   LMP  (LMP Unknown)   SpO2 100%   BMI 47.68 kg/m  Wt Readings from Last 3 Encounters:  02/24/24 (!) 313 lb 9.6 oz (142.2 kg)  10/21/23 (!) 309 lb (140.2 kg)  04/03/23 (!) 317 lb 12.8 oz (144.2 kg)    Physical Exam Vitals and nursing note reviewed.  Constitutional:      Appearance: She is well-developed.  HENT:     Head: Normocephalic and atraumatic.  Cardiovascular:     Rate and Rhythm: Normal rate and regular rhythm.     Heart sounds: Normal heart sounds. No murmur heard.    No friction rub. No gallop.  Pulmonary:     Effort: Pulmonary effort is normal. No tachypnea or respiratory distress.     Breath sounds: Normal breath sounds. No decreased breath sounds, wheezing, rhonchi or rales.  Chest:     Chest wall: No tenderness.  Musculoskeletal:        General: Normal range of motion.     Cervical back: Normal range of motion.  Skin:    General: Skin is warm and dry.  Neurological:     Mental Status: She is alert and oriented to person, place, and time.     Coordination: Coordination normal.  Psychiatric:        Behavior: Behavior normal. Behavior is cooperative.        Thought Content: Thought content normal.        Judgment: Judgment normal.          Patient has been counseled extensively about nutrition and exercise as well as the importance of adherence with  medications and regular follow-up. The patient was given clear instructions to go to ER or return to medical center if symptoms don't improve, worsen or new problems develop. The patient verbalized understanding.   Follow-up: Return in about 3 months (around 05/25/2024).   Haze LELON Servant, FNP-BC [facility] and [facility] [place], [place] [phone]   02/24/2024, 8:57 AM

## 2024-04-08 ENCOUNTER — Ambulatory Visit
Admission: RE | Admit: 2024-04-08 | Discharge: 2024-04-08 | Disposition: A | Discharge status: Home / Self Care | Source: Ambulatory Visit | Attending: Nurse Practitioner | Admitting: Nurse Practitioner

## 2024-04-08 DIAGNOSIS — Z1231 Encounter for screening mammogram for malignant neoplasm of breast: Secondary | ICD-10-CM

## 2024-04-13 ENCOUNTER — Ambulatory Visit: Payer: Self-pay | Admitting: Nurse Practitioner

## 2024-05-25 ENCOUNTER — Encounter: Payer: Self-pay | Admitting: Nurse Practitioner

## 2024-05-25 ENCOUNTER — Ambulatory Visit: Attending: Nurse Practitioner | Admitting: Nurse Practitioner

## 2024-05-25 VITALS — BP 117/79 | HR 90 | Resp 19 | Ht 68.0 in | Wt 310.2 lb

## 2024-05-25 DIAGNOSIS — Z7182 Exercise counseling: Secondary | ICD-10-CM

## 2024-05-25 DIAGNOSIS — Z23 Encounter for immunization: Secondary | ICD-10-CM | POA: Diagnosis not present

## 2024-05-25 DIAGNOSIS — F419 Anxiety disorder, unspecified: Secondary | ICD-10-CM | POA: Diagnosis not present

## 2024-05-25 DIAGNOSIS — Z713 Dietary counseling and surveillance: Secondary | ICD-10-CM

## 2024-05-25 DIAGNOSIS — I10 Essential (primary) hypertension: Secondary | ICD-10-CM

## 2024-05-25 DIAGNOSIS — R7303 Prediabetes: Secondary | ICD-10-CM

## 2024-05-25 DIAGNOSIS — R7989 Other specified abnormal findings of blood chemistry: Secondary | ICD-10-CM

## 2024-05-25 DIAGNOSIS — F32A Depression, unspecified: Secondary | ICD-10-CM

## 2024-05-25 MED ORDER — SERTRALINE HCL 50 MG PO TABS: 25.0000 mg | ORAL_TABLET | Freq: Every day | ORAL | 1 refills | Status: DC

## 2024-05-25 MED ORDER — SERTRALINE HCL 50 MG PO TABS: 50.0000 mg | ORAL_TABLET | Freq: Every day | ORAL | 1 refills | Status: AC

## 2024-05-25 MED ORDER — CARVEDILOL 6.25 MG PO TABS: 6.2500 mg | ORAL_TABLET | Freq: Two times a day (BID) | ORAL | 1 refills | Status: AC

## 2024-05-25 MED ORDER — AMLODIPINE BESYLATE 10 MG PO TABS: 10.0000 mg | ORAL_TABLET | Freq: Every day | ORAL | 1 refills | Status: AC

## 2024-05-25 NOTE — Progress Notes (Signed)
 Assessment & Plan:  [name] was seen today for hypertension.  Diagnoses and all orders for this visit:  Primary hypertension -     amLODipine  (NORVASC ) 10 MG tablet; Take 1 tablet (10 mg total) by mouth daily. -     carvedilol  (COREG ) 6.25 MG tablet; Take 1 tablet (6.25 mg total) by mouth 2 (two) times daily with a meal. Continue all antihypertensives as prescribed.  Reminded to bring in blood pressure log for follow  up appointment.  RECOMMENDATIONS: DASH/Mediterranean Diets are healthier choices for HTN.     Anxiety and depression -     sertraline  (ZOLOFT ) 50 MG tablet; Take 1 tablet (50 mg total) by mouth daily. DOSE INCREASE  Prediabetes -     Hemoglobin A1c -     CMP14+EGFR  Need for pneumococcal 20-valent conjugate vaccination -     Pneumococcal conjugate vaccine 20-valent  Need for shingles vaccine -     Varicella-zoster vaccine IM  Abnormal CBC -     CBC with Differential/Platelet    Patient has been counseled on age-appropriate routine health concerns for screening and prevention. These are reviewed and up-to-date. Referrals have been placed accordingly. Immunizations are up-to-date or declined.    Subjective:   Chief Complaint  Patient presents with   Hypertension    Courtney Nelson 48 y.o. female presents to office today for follow up to HTN  She has a past medical history of Anemia, Anxiety, Depression, Hypertension, and Seizures.   HTN Blood pressure is well controlled. She is currently taking amlodipine  10 mg  and carvedilol  6.25 mg BID.  BP Readings from Last 3 Encounters:  05/25/24 117/79  02/24/24 132/86  10/21/23 115/79    We had a discussion today about her SSRI zoloft . At this time the decision was made to increase from 25 to 50 mg. She will follow up with me in a few weeks regarding effectiveness.       02/24/2024    9:25 AM 10/21/2023    9:46 AM 04/03/2023   10:17 AM  Depression screen PHQ 2/9  Decreased Interest 1 0  0  Down, Depressed, Hopeless 2 0 1  PHQ - 2 Score 3 0 1  Altered sleeping 1 0 1  Tired, decreased energy 1 1 1   Change in appetite 0 0 0  Feeling bad or failure about yourself  0 0 1  Trouble concentrating 1 0 1  Moving slowly or fidgety/restless 0 0 0  Suicidal thoughts 0 0 0  PHQ-9 Score 6  1  5    Difficult doing work/chores Somewhat difficult Not difficult at all      Data saved with a previous flowsheet row definition     Review of Systems  Constitutional:  Negative for fever, malaise/fatigue and weight loss.  HENT: Negative.  Negative for nosebleeds.   Eyes: Negative.  Negative for blurred vision, double vision and photophobia.  Respiratory: Negative.  Negative for cough and shortness of breath.   Cardiovascular: Negative.  Negative for chest pain, palpitations and leg swelling.  Gastrointestinal: Negative.  Negative for heartburn, nausea and vomiting.  Musculoskeletal: Negative.  Negative for myalgias.  Neurological: Negative.  Negative for dizziness, focal weakness, seizures and headaches.  Psychiatric/Behavioral:  Positive for depression. Negative for suicidal ideas.     Past Medical History:  Diagnosis Date   Anemia    Anxiety    Hypertension    on meds   Seizures (HCC)    infantile seizures- last episode-  age 7    Past Surgical History:  Procedure Laterality Date   mirena  insertion     inserted 12-21-17   WISDOM TOOTH EXTRACTION      Family History  Problem Relation Age of Onset   Hypertension Mother    Diabetes Mother    Thyroid  cancer Mother    Stroke Maternal Grandmother    Diabetes Maternal Grandmother    Hypertension Maternal Grandfather    Diabetes Maternal Grandfather    Colon polyps Neg Hx    Colon cancer Neg Hx    Esophageal cancer Neg Hx    Rectal cancer Neg Hx    Stomach cancer Neg Hx    Breast cancer Neg Hx     Social History Reviewed with no changes to be made today.   Outpatient Medications Prior to Visit  Medication Sig Dispense  Refill   acetaminophen  (TYLENOL ) 500 MG tablet Take 2 tablets (1,000 mg total) by mouth every 6 (six) hours as needed. 30 tablet 0   levonorgestrel  (MIRENA ) 20 MCG/DAY IUD 1 each by Intrauterine route once.     amLODipine  (NORVASC ) 10 MG tablet Take 1 tablet (10 mg total) by mouth daily. 90 tablet 1   carvedilol  (COREG ) 6.25 MG tablet Take 1 tablet (6.25 mg total) by mouth 2 (two) times daily with a meal. 180 tablet 1   sertraline  (ZOLOFT ) 25 MG tablet Take 1 tablet (25 mg total) by mouth daily. 90 tablet 3   No facility-administered medications prior to visit.    No Known Allergies     Objective:    BP 117/79 (BP Location: Left Arm, Patient Position: Sitting, Cuff Size: Normal)   Pulse 90   Resp 19   Ht 5' 8 (1.727 m)   Wt (!) 310 lb 3.2 oz (140.7 kg)   SpO2 98%   BMI 47.17 kg/m  Wt Readings from Last 3 Encounters:  05/25/24 (!) 310 lb 3.2 oz (140.7 kg)  02/24/24 (!) 313 lb 9.6 oz (142.2 kg)  10/21/23 (!) 309 lb (140.2 kg)    Physical Exam Vitals and nursing note reviewed.  Constitutional:      Appearance: She is well-developed.  HENT:     Head: Normocephalic and atraumatic.  Cardiovascular:     Rate and Rhythm: Normal rate and regular rhythm.     Heart sounds: Normal heart sounds. No murmur heard.    No friction rub. No gallop.  Pulmonary:     Effort: Pulmonary effort is normal. No tachypnea or respiratory distress.     Breath sounds: Normal breath sounds. No decreased breath sounds, wheezing, rhonchi or rales.  Chest:     Chest wall: No tenderness.  Musculoskeletal:        General: Normal range of motion.     Cervical back: Normal range of motion.  Skin:    General: Skin is warm and dry.  Neurological:     Mental Status: She is alert and oriented to person, place, and time.     Coordination: Coordination normal.  Psychiatric:        Behavior: Behavior normal. Behavior is cooperative.        Thought Content: Thought content normal.        Judgment: Judgment  normal.          Patient has been counseled extensively about nutrition and exercise as well as the importance of adherence with medications and regular follow-up. The patient was given clear instructions to go to ER or return to medical  center if symptoms don't improve, worsen or new problems develop. The patient verbalized understanding.   Follow-up: Return in about 3 months (around 08/23/2024).   Haze LELON Servant, FNP-BC [facility] and [facility] [place], [place] [phone]   05/25/2024, 9:47 AM

## 2024-05-26 LAB — CBC WITH DIFFERENTIAL/PLATELET
Basophils Absolute: 0 x10E3/uL (ref 0.0–0.2)
Basos: 1 %
EOS (ABSOLUTE): 0.1 x10E3/uL (ref 0.0–0.4)
Eos: 1 %
Hematocrit: 37.6 % (ref 34.0–46.6)
Hemoglobin: 11.9 g/dL (ref 11.1–15.9)
Immature Grans (Abs): 0 x10E3/uL (ref 0.0–0.1)
Immature Granulocytes: 0 %
Lymphocytes Absolute: 2.4 x10E3/uL (ref 0.7–3.1)
Lymphs: 40 %
MCH: 26 pg — ABNORMAL LOW (ref 26.6–33.0)
MCHC: 31.6 g/dL (ref 31.5–35.7)
MCV: 82 fL (ref 79–97)
Monocytes Absolute: 0.7 x10E3/uL (ref 0.1–0.9)
Monocytes: 11 %
Neutrophils Absolute: 2.9 x10E3/uL (ref 1.4–7.0)
Neutrophils: 47 %
Platelets: 351 x10E3/uL (ref 150–450)
RBC: 4.58 x10E6/uL (ref 3.77–5.28)
RDW: 14 % (ref 11.7–15.4)
WBC: 6.1 x10E3/uL (ref 3.4–10.8)

## 2024-05-26 LAB — CMP14+EGFR
ALT: 7 IU/L (ref 0–32)
AST: 11 IU/L (ref 0–40)
Albumin: 3.7 g/dL — ABNORMAL LOW (ref 3.9–4.9)
Alkaline Phosphatase: 76 IU/L (ref 41–116)
BUN/Creatinine Ratio: 12 (ref 9–23)
BUN: 10 mg/dL (ref 6–24)
Bilirubin Total: 0.4 mg/dL (ref 0.0–1.2)
CO2: 24 mmol/L (ref 20–29)
Calcium: 9.1 mg/dL (ref 8.7–10.2)
Chloride: 103 mmol/L (ref 96–106)
Creatinine, Ser: 0.84 mg/dL (ref 0.57–1.00)
Globulin, Total: 3.6 g/dL (ref 1.5–4.5)
Glucose: 102 mg/dL — ABNORMAL HIGH (ref 70–99)
Potassium: 4.3 mmol/L (ref 3.5–5.2)
Sodium: 138 mmol/L (ref 134–144)
Total Protein: 7.3 g/dL (ref 6.0–8.5)
eGFR: 85 mL/min/1.73 (ref >59)

## 2024-05-26 LAB — HEMOGLOBIN A1C
Est. average glucose Bld gHb Est-mCnc: 126 mg/dL
Hgb A1c MFr Bld: 6 % — ABNORMAL HIGH (ref 4.8–5.6)

## 2024-05-29 ENCOUNTER — Ambulatory Visit: Payer: Self-pay | Admitting: Nurse Practitioner

## 2024-08-23 ENCOUNTER — Ambulatory Visit: Admitting: Nurse Practitioner
